# Patient Record
Sex: Female | Born: 1935 | Race: White | Hispanic: No | Smoking: Former smoker
Health system: Southern US, Community
[De-identification: ages and names within clinical notes are randomized; demographics above are authoritative.]

## PROBLEM LIST (undated history)

## (undated) DIAGNOSIS — E039 Hypothyroidism, unspecified: Secondary | ICD-10-CM

## (undated) DIAGNOSIS — I10 Essential (primary) hypertension: Secondary | ICD-10-CM

---

## 2002-01-09 ENCOUNTER — Encounter: Payer: Self-pay | Admitting: Orthopedic Surgery

## 2002-01-15 ENCOUNTER — Encounter (INDEPENDENT_AMBULATORY_CARE_PROVIDER_SITE_OTHER): Payer: Self-pay | Admitting: Specialist

## 2002-01-15 ENCOUNTER — Inpatient Hospital Stay (HOSPITAL_COMMUNITY): Admission: RE | Admit: 2002-01-15 | Discharge: 2002-01-17 | Payer: Self-pay | Admitting: Orthopedic Surgery

## 2002-01-15 ENCOUNTER — Encounter: Payer: Self-pay | Admitting: Orthopedic Surgery

## 2002-01-17 ENCOUNTER — Inpatient Hospital Stay (HOSPITAL_COMMUNITY)
Admission: AD | Admit: 2002-01-17 | Discharge: 2002-01-27 | Payer: Self-pay | Admitting: Physical Medicine & Rehabilitation

## 2006-12-24 ENCOUNTER — Ambulatory Visit: Payer: Self-pay | Admitting: Cardiology

## 2007-07-22 ENCOUNTER — Inpatient Hospital Stay (HOSPITAL_COMMUNITY): Admission: RE | Admit: 2007-07-22 | Discharge: 2007-07-26 | Payer: Self-pay | Admitting: Orthopedic Surgery

## 2010-08-02 NOTE — Op Note (Signed)
Tina Wheeler, Tina Wheeler                  ACCOUNT NO.:  [id number]   MEDICAL RECORD NO.:  [id number]          PATIENT TYPE:  INP   LOCATION:  0011                         FACILITY:  [facility]   PHYSICIAN:  Ollen Gross, M.D.    DATE OF BIRTH:  Oct 22, 1931   DATE OF PROCEDURE:  07/22/2007  DATE OF DISCHARGE:                               OPERATIVE REPORT   PREOPERATIVE DIAGNOSIS:  Recurrent instability, right hip.   POSTOPERATIVE DIAGNOSIS:  Recurrent instability, right hip.   PROCEDURE:  Right total hip arthroplasty revision.   SURGEON:  Ollen Gross, M.D.   ASSISTANT:  Avel Peace PA-C   ANESTHESIA:  General.   ESTIMATED BLOOD LOSS:  300 mL.   DRAINS:  Hemovac times one.   COMPLICATIONS:  None.   CONDITION:  Stable to recovery.   BRIEF CLINICAL NOTE:  Tina Wheeler is a 77 year old female who had a right  total hip arthroplasty revision done with a femoral osteotomy  approximately 5-6 years ago.  She did well until a few months ago when  she started dislocating.  She has had about three dislocations in a  relatively short amount of time.  She presents now for acetabular  revision to constrained liner versus component revision with upsizing  and femoral head.   PROCEDURE IN DETAIL:  After the successful initiation of general  anesthetic, the patient was placed in left lateral decubitus position  with the right side up and held with the hip positioner.  Right lower  extremity was isolated from the perineum with plastic drapes and prepped  and draped in usual sterile fashion.  Posterolateral incision made with  10 blade through subcutaneous tissue to level of fascia lata which was  incised in line with the skin incision.  Sciatic nerve is palpated and  protected and the posterior pseudocapsule excision is completed.  She  already had part of the pseudocapsule avulsed from the femur from her  dislocations.  I excised the pseudocapsule to get down to the acetabular  component.  Hip  dislocated at 70 degrees of flexion and 40 degrees  adduction, 20 degrees internal rotation.  I removed the femoral head.  We attempted to translate the femur anteriorly but could not get good  acetabular closure.  Given that this was SROM stem, I was able to remove  the stem, first by disrupting the taper between the stem and sleeve and  then using the extraction device to take the stem out.  This allowed for  outstanding acetabular exposure.  I removed the tissue around the rim of  the acetabulum and then removed the polyethylene from the cup.  There  were two screws.  There is a 48 mm Osteonics cup.  When I removed the  screws, the cup had some toggle in it and I used the extraction tools to  remove the cup.  The cup was easily removed with essentially no bone  loss at all.  The acetabulum was then reamed starting at 47 and going up  to 51 mm.  A 52 mm Pinnacle Gription multihole cup was then  impacted  with excellent rim fit.  I also placed two additional dome screws with  excellent purchase.  We placed a 36-mm neutral +4 Marathon liner with a  52 cup.  We then reimpacted the long femoral stem back into the femur.  It was the same anteversion as before.  This is about 20 degrees  anteversion.  Once the stem was impacted then I placed a trial 36 + 0  head.  This reduced too easily so we ended up going to a 36 +6 which  still reduced easily and 36 +9 which had the appropriate soft tissue  tension.  There was excellent stability with full extension, full  external rotation, 70 degrees flexion, 40 degrees adduction, 90 degrees  internal rotation and 90 degrees of flexion, 70 degrees of internal  rotation.  By placing the right leg on top of the left, it felt as  though the leg lengths were equal.  The hip was then dislocated and the  permanent 36 + 9 head is placed.  Wounds are copiously irrigated with  saline solution and the posterior pseudocapsule reattached to the femur  through drill  holes.  Fascia lata was closed over Hemovac drain with the  #2 Quill suture.  Deep subcu closed with the 0 Quill suture and the  superficial subcu closed with interrupted 2-0 Vicryl.  The skin is  closed with staples.  Drain was hooked to suction.  Incision cleaned and  dried.  A bulky sterile dressing applied.  She is placed into a knee  immobilizer, awakened and transported to recovery in stable condition.      Ollen Gross, M.D.  Electronically Signed     FA/MEDQ  D:  07/22/2007  T:  07/22/2007  Job:  [id number]

## 2010-08-02 NOTE — Discharge Summary (Signed)
Tina Wheeler, Tina Wheeler                  ACCOUNT NO.:  [id number]   MEDICAL RECORD NO.:  [id number]          PATIENT TYPE:  INP   LOCATION:  1612                         [facility]ACILITY:  [facility]   PHYSICIAN:  Ollen Gross, M.D.    DATE OF BIRTH:  06-May-1931   DATE OF ADMISSION:  07/22/2007  DATE OF DISCHARGE:  07/26/2007                               DISCHARGE SUMMARY   ADMITTING DIAGNOSES:  1. Unstable right total hip.  2. Chronic obstructive pulmonary disease.  3. Hypertension.  4. Cardiac murmur.  5. Varicose veins.  6. Hypothyroidism.  7. Chronic anemia.  8. Chronic low back pain.   DISCHARGE DIAGNOSES:  1. Recurrent instability, right hip, status post revision right total      hip arthroplasty.  2. Mild postoperative blood loss anemia, did not require transfusion.  3. Hyponatremia.  4. Chronic obstructive pulmonary disease.  5. Hypertension.  6. Cardiac murmur.  7. Varicose veins.  8. Hypothyroidism.  9. Chronic anemia.  10.Chronic low back pain.   PROCEDURE:  Jul 22, 2007, revision right total hip arthroplasty.  Surgeon: Dr. Lequita Halt.  Assistant:  Avel Peace, PA-C.  Anesthesia:  General.   CONSULTATIONS:  None.   BRIEF HISTORY:  Ms. Stolze is a 77 year old female who had a right total  hip revision with a femoral osteotomy approximately 5 or 6 years ago.  She did well until a couple of months ago when she started dislocating.  She had about three dislocations in a relatively short amount of time.  Now she presents for an acetabular versus constrained liner.   LABORATORY DATA:  CBC on admission:  Hemoglobin of 13.9, hematocrit of  41.0, white cell count 5.8, red cell count 4.56, platelets 216.  Chem  panel on admission:  Minimally elevated glucose of 111.  Remaining Chem  panel all within normal limits.  PT/INR preop 12.6, 0.9 with a PTT of  25.  Preop UA:  Positive nitrite, large leukocyte esterase, 7-10 white  cell, 0-2 red cells, many bacteria.  This was treated  preoperatively.  Serial CBCs were followed.  Hemoglobin did drop down to 9.1, then 8.6,  came back to 9.0 with a hematocrit of 25.9.  Serial pro times followed.  Last noted PT/INR 18.9 and 1.5.  Serial BMETs were followed.  Sodium did  drop down to 132, stabilized at 131.  Remaining electrolytes remained  within normal limits.  Glucose went up to 126, back down to 120.  Follow-  up UA on Jul 24, 2007:  Negative.  Urine culture: no growth, final.   X-RAYS:  Portable pelvis Jul 22, 2007: status post revision right total  hip arthroplasty.   HOSPITAL COURSE:  The patient was admitted to [facility],  tolerated the procedure well, later transferred to the recovery room and  orthopedic floor, started on PCA and p.o. analgesics.  Given 24 hours of  postop IV antibiotics.  Started on Coumadin for DVT prophylaxis.  She  was pretty sore on the morning of day #1.  Hemoglobin was down.  She had  decent output in her Foley.  Started on iron supplement with the low  hemoglobin.  She did have a known history of chronic anemia.  Blood  pressure was stable, actually normotensive, so we held her diltiazem  below a systolic of 130, started her back on her home medications.  Encouraged incentive spirometer.  Started getting up out of bed, bed to  chair.  By day #2 she was walking short distances of about 11 feet.  She  was doing pretty well but having some soreness and spasms.  Encouraged  muscle relaxants.  Hemoglobin was down a little bit, down to 8.6.  We  left her Foley in for an extra day.  Put her on iron supplementation on  day #1.  We continued that.  She had a preoperative urinary tract  infection which was treated with prior to surgery, so we rechecked it on  day #2 before the Foley was removed.  The follow-up UA was negative.  Follow-up culture was completely negative, no growth.  Slowly  progressed, only walking about 17 feet by postop day #2.  Day #3  initially she wanted to see if she  could go home but with the slow  progression it was felt that she would possibly need inpatient rehab.  We did get social work and discharge planning involved.  FL-2 was signed  and sent out.  By day #3 she was still a little sore, having some  spasms, running a little low-grade temperature.  It was noted that a bed  became available later on that afternoon but with the continued pain and  the elevated temperature, we wanted to watch her one more day.  Encouraged incentive spirometer.  She had not had a bowel movement,  either.  Given a suppository.  Started moving her bowels.  Had a couple  of movements.  Temperature had come back down and by the following day  of Jul 26, 2007, she was doing much better.  Temperature was decreasing.  She was under better control with her pain and spasm and it was decided  she would be discharged at that time.   DISCHARGE/PLAN:  The patient was discharged out on Jul 26, 2007, to [facility] [place]   DISCHARGE DIAGNOSES:  Please see above.   DISCHARGE MEDICATIONS:  Current medications include:   1. Levothyroxine 125 mcg p.o. daily.  2. Cardizem CD 240 mg p.o. daily.  3. Oxybutynin 10 mg p.o. daily.  4. Coumadin protocol.  She needs to be on Coumadin for 3 weeks from      the date of surgery of Jul 22, 2007.  Please titrate the Coumadin      level for target INR between 2.0 and 3.0.  adjust as needed.  5. Colace 100 mg p.o. b.i.d.  6. Nu-Iron 150 mg p.o. b.i.d.  She needs to be on iron supplementation      for 3 weeks from the date of surgery of Jul 22, 2007, then      discontinue the iron.  7. Percocet 5 mg one of two every 4-6 hours as needed for pain.  8. Tylenol 325 mg one or two every 4-6 hours as needed for mild pain,      temperature or headache.  9. Robaxin 500 mg p.o. q.6-8 h. p.r.n. spasm.   ACTIVITY:  She is she is partial weightbearing, 25-50% to the right  lower extremity.  She needs to maintain hip precautions, use  total hip  protocol.  May be  up ambulating with therapy.  She needs to have PT  every day, OT for ADLs.   Daily dressing change.  She did have a little bit of serous drainage  from the incision but no signs of infection.  Continue daily dressing  changes.  She may start showering; however, do not submerge the incision  under water.   FOLLOW-UP:  She is to follow up next Thursday on May 14 or next Friday  on May 15 for postop evaluation, wound check and staple removal.  Please  contact our office at [phone] to help arrange an appointment and  transfer this patient over for follow-up on next Thursday or Friday, May  14 or May 15, with Dr. Ollen Gross at the [facility] office of  [facility].   DIET:  As tolerated.   DISPOSITION:  [facility] in the [place].   CONDITION UPON DISCHARGE:  Improving.      Alexzandrew L. Perkins, P.A.C.      Ollen Gross, M.D.  Electronically Signed    ALP/MEDQ  D:  07/26/2007  T:  07/26/2007  Job:  [id number]   cc:   Meredith Mody, MD  [facility]  [address]  [place], [place]  FAX [phone]   [facility]  [place], [place]

## 2010-08-02 NOTE — H&P (Signed)
Tina Wheeler, Tina Wheeler                  ACCOUNT NO.:  [id number]   MEDICAL RECORD NO.:  [id number]          PATIENT TYPE:  INP   LOCATION:  NA                           FACILITY:  [facility]   PHYSICIAN:  Ollen Gross, M.D.    DATE OF BIRTH:  1932-01-16   DATE OF ADMISSION:  07/22/2007  DATE OF DISCHARGE:                              HISTORY & PHYSICAL   CHIEF COMPLAINT:  Unstable right hip with recurrent dislocation.   HISTORY OF PRESENT ILLNESS:  The patient is a 77 year old female who  unfortunately has undergone recurrent dislocations requiring closed  reductions of her unstable right total hip. It has become more unstable  recently. She has been seen by Dr. Lequita Halt and felt that she would  benefit undergoing revision, conversion over to constrained liner versus  the right acetabular revision.  Risks and benefits have been discussed,  and she elects to proceed with surgery.   ALLERGIES:  No known drug allergies.   CURRENT MEDICATIONS:  Oxybutynin, ibuprofen, diltiazem XR,  levothyroxine, hydrocodone.   PAST MEDICAL HISTORY:  COPD, hypertension, cardiac murmur, varicose  veins, hypothyroidism, chronic anemia, chronic low back pain.   PAST SURGERIES:  Right total hip in October 2003.  She has undergone  several closed reductions for dislocations.   SOCIAL HISTORY:  Widowed, past smoker.  No alcohol.  Seven children  living. Family will be assisting with care after surgery.   FAMILY HISTORY:  She has a daughter with breast cancer.   REVIEW OF SYSTEMS:  GENERAL:  No fevers, chills or night sweats.  NEUROLOGICAL: No seizure, syncope, or paralysis.  RESPIRATORY: No  shortness of breath, productive cough, or hemoptysis.  CARDIOVASCULAR:  No chest pain or orthopnea.  GI: No nausea, vomiting, diarrhea, or  constipation.  GU: She does have some urinary frequency.  No dysuria or  hematuria.  MUSCULOSKELETAL:  Recurrent dislocations of the right hip.   PHYSICAL EXAMINATION:  VITAL SIGNS:  Pulse 72, respirations 14, blood  pressure 160/74.  GENERAL:  A 75-year white female, well-nourished, well-developed in no  acute distress.  She is alert and cooperative, awake, accompanied by her  family.  HEENT: Normocephalic, atraumatic.  Pupils are round and reactive.  Noted  to wear glasses.  EOM's intact.  NECK:  Supple.  CHEST: Clear.  HEART: Regular rate and rhythm with a faint early systolic ejection  murmur noted.  S1-S2 noted.  ABDOMEN: Soft, round, slightly protuberant.  Bowel sounds present.  Rectal, breasts, and genitalia not done as not pertinent to present  illness.  EXTREMITIES:  Right hip flexion 95, internal rotation 20, external  rotation 20, abduction 30. She does have a hip abduction brace in place.   IMPRESSION:  1. Unstable right total hip.  2. Chronic obstructive pulmonary disease.  3. Hypertension.  4. Cardiac murmur.  5. Varicose pain.  6. Hypothyroidism.  7. Chronic anemia.  8. Chronic low back pain.   PLAN:  The patient was admitted to [facility] to undergo a  right acetabular revision versus a constrained liner surgery will be  performed by Dr.  Homero Fellers Aluisio.      Alexzandrew L. Perkins, P.A.C.      Ollen Gross, M.D.  Electronically Signed    ALP/MEDQ  D:  07/21/2007  T:  07/21/2007  Job:  [id number]   cc:   Meredith Mody, MD  [facility]  [address]  [place], [place]  EAV # [phone]

## 2010-08-05 NOTE — Discharge Summary (Signed)
NAME:  Tina Wheeler, Tina Wheeler                            ACCOUNT NO.:  [id number]   MEDICAL RECORD NO.:  [id number]                   PATIENT TYPE:  INP   LOCATION:  0478                                 FACILITY:  [facility]   PHYSICIAN:  Ollen Gross, M.D.                 DATE OF BIRTH:  06/18/1931   DATE OF ADMISSION:  01/15/2002  DATE OF DISCHARGE:  01/17/2002                                 DISCHARGE SUMMARY   ADMITTING DIAGNOSES:  1. Loose right total hip replacement arthroplasty.  2. Hypertension.  3. Hypothyroidism.   DISCHARGE DIAGNOSES:  1. Loose right total hip arthroplasty status post revision right total hip     arthroplasty with corrective femoral osteotomy and strut Allografting of     the femur.  2. Postoperative blood loss anemia.  3. Status post transfusion cell saver blood approximately 300 cc.  4. Postoperative hyponatremia.  5. Hypertension.  6. Hypothyroidism.   PROCEDURE:  The patient was taken to the OR on January 15, 2002.  Underwent  a revision right total hip replacement arthroplasty with corrected femoral  osteotomy and strut Allografting of the femur.  Surgeon Dr. Homero Fellers Aluisio.  Assistant Avel Peace, P.A.-C.  Surgery under general anesthesia.  Estimated blood loss 800 cc.  Hemovac drain x1.  She did have blood  transfusion replacement of approximately 300 cc of cell saver packed red  cells.   CONSULTS:  Rehabilitation Services.   BRIEF HISTORY:  The patient is a 77 year old female who has been seen for  ongoing right pain.  She is seen in second opinion in regard to a painful  right hip.  This has been ongoing for several months now.  Original problem  dates back to 1999 when she had fixation of an intertrochanteric hip  fracture by Dr. Jenelle Mages in [place].  The hip fixation device went on to  collapse and in 2000 she underwent a calcar hip replacement.  She did fairly  well until recently this year when she started developing progressive  problems.   She was seen by Dr. Grayland Jack who took x-rays demonstrating a  loose femoral prosthesis.  She was referred over to Dr. Lequita Halt for  consideration of surgery.  X-rays showed a grossly loose femoral component  which subsided over 1 cm from 2001.  There was loose cement with a cracked  cement mantle and bending of the lateral cortex and some balloon dilatation  at the lateral cortex.  Due to the significant findings on examination and x-  ray and also the pending catastrophic failure of the hip arthroplasty  prosthesis, it is felt she would best be served by undergoing revision of  the prosthesis.  Surgery will be performed by Dr. Ollen Gross.  Risks and  benefits discussed and the patient has elected to proceed with surgery.   LABORATORY DATA:  CBC on admission hemoglobin 10.8, hematocrit 32.6, white  cell count 5.0, red cell count 4.22.  Postoperative H&H 11.08 and 36.2.  This was post transfusion cell saver blood.  Last noted H&H was 10.6 and  31.5.  Differential on admission all within normal limits.  PT and PTT on  admission were 12.8 and 29, respectively with INR of 0.9.  Serial pro times  followed.  Last noted PT/INR prior to transfer was 18.6 and 1.7.  Chem panel  on admission all within normal limits.  Follow-up BMET showed a drop in  sodium down to 134, increasing glucose to 145.  Follow-up BMET showed a  continued drop in sodium down to 130, glucose back down to 132.  Urinalysis  on admission only showed small leukocyte esterase, only few epithelial  cells, 3-6 white cells, and few bacteria.  Blood group type A+.  Gram stain  of the fluid taken at the time of surgery on January 15, 2002:  No  organisms, no white cells.  Wound culture taken at the time of surgery on  January 15, 2002:  No wbc's, no organisms, no growth.  Anaerobic culture:  No wbc's, no organisms, and no growth.   X-rays taken on January 09, 2002:  Right total hip replacement arthroplasty.   Surgical pathology  report:  Right femoral canal excisional biopsy:  Chronic  synovitis with foreign body, giant cell reaction, granulation tissue, and  fibrosis.  Right hip capsule excisional biopsy:  Chronic synovitis with  foreign body, giant cell reaction, granulation tissue, and fibrosis.  No  acute inflammation is identified in either specimen.  Frozen sections of  right femoral canal and also right hip capsule both showed no significant  acute inflammation.   HOSPITAL COURSE:  The patient was admitted to [facility], taken to  OR.  Underwent above stated procedure without complications.  She did have a  fair amount of blood loss.  Cell saver was used.  She was transfused back  300 cc of cell saver blood.  She tolerated the transfusion well.  She was  placed on PC analgesics and p.o. analgesics for pain control following  surgery.  She was given 48 hours of postoperative IV antibiotics.  Laboratories were followed.  They can be found in laboratory section as  above.  The Hemovac drain was left in on day one and pulled on day two  without difficulty.  On postoperative day one patient was doing actually  very well for the amount of surgery she had undergone.  Her main complaint  was a sore throat secondary to the endotracheal tube from her general  anesthetic.  She was up out of bed with physical therapy.  By postoperative  day two she was actually ambulating approximately 10 feet and anticipated a  slow progress.  Therefore, the patient was seen in consultation by  rehabilitation services.  It was felt the patient would be appropriate  candidate for rehabilitation stay.  On day two her pain control was much  improved.  The PCA and IV were discontinued.  Her hemoglobin was stable at  10.6.  She was up moving with therapy.  It was noted later that day that the  patient did have a bed open up on rehabilitation services.  The patient was doing well.  She was slowly progressing with physical therapy  and felt to be  an appropriate candidate for the rehabilitation stay.  She was  hemodynamically stable, tolerating her medications, and it was decided the  patient would be transferred to  rehabilitation for continued therapy.   DISCHARGE PLAN:  The patient transferred to [facility].   DISCHARGE DIAGNOSES:  Please see above.   DISCHARGE MEDICATIONS:  1. Percocet for pain.  2. Robaxin for spasm.  3. She is currently on Trinsicon t.i.d.  4. Colace 100 mg p.o. b.i.d.  5. Atenolol 50 mg p.o. q.d.  6. Synthroid 100 mcg p.o. q.d.  7. Coumadin per pharmacy for three weeks.   DIET:  Low sodium diet.   ACTIVITY:  Touchdown weightbearing to the right lower extremity.  Continue  with physical therapy and occupational therapy for gait training ambulation,  ADLs while in rehabilitation services.  Hip precautions at all times.  May  start showering around postoperative day four.   DISPOSITION:  [facility].   FOLLOW UP:  The patient will follow up in the office two weeks from surgery  or following the discharge from the rehabilitation services.  It was noted  patient did have low sodium at the time of transfer.  Recommend follow-up  BMET for the hyponatremia.   CONDITION ON DISCHARGE:  Improved.     Alexzandrew L. Julien Girt, P.A.              Ollen Gross, M.D.    ALP/MEDQ  D:  02/12/2002  T:  02/12/2002  Job:  [id number]   cc:   Rehab Services   Arville Care, M.D.  [address], [place]

## 2010-08-05 NOTE — Discharge Summary (Signed)
NAME:  Tina Wheeler, Tina Wheeler                            ACCOUNT NO.:  [id number]   MEDICAL RECORD NO.:  [id number]                   PATIENT TYPE:  INP   LOCATION:  4140                                 FACILITY:  [facility]   PHYSICIAN:  Erick Colace, M.D.           DATE OF BIRTH:  11-Apr-1931   DATE OF ADMISSION:  01/17/2002  DATE OF DISCHARGE:  01/27/2002                                 DISCHARGE SUMMARY   DISCHARGE DIAGNOSES:  1. Revision of right total hip arthroplasty 01/15/02.  2. Anemia.  3. Hypertension.  4. Hypothyroidism.  5. History of a right total hip replacement in 2000.   HISTORY OF PRESENT ILLNESS:  A 77 year old female with history of right  intertrochanteric hip fracture in 1999 with repair per Dr. Jenelle Mages in  [place] with subsequent collapse of hip fixation device, admitted to  [facility] 10/29 with persistent right hip pain times several  months.  On evaluation, x-rays with loosening of femoral components.  Underwent revision of right total hip arthroplasty 01/15/02 per Dr. Lequita Halt,  placed on Coumadin for deep vein thrombosis prophylaxis, touchdown  weightbearing.  Postoperative pain management.  No chest pain or shortness  of breath.  Latest INR 1.7.  Hemoglobin 10.6.  Admitted for comprehensive  rehab program.   PAST MEDICAL HISTORY:  See discharge diagnoses.   ALLERGIES:  MORPHINE.   SOCIAL HISTORY:  Remote smoker, no alcohol.  Divorced.  Lives with daughter  and son-in-law.  Independent with a cane prior to admission.  Two-level home  without steps to entry, bedroom downstairs.  Son-in-law can assist on  discharge.   PAST SURGICAL HISTORY:  Cholecystectomy, tubal ligation.   PRIMARY CARE PHYSICIAN:  Dr. Arville Care, [phone].   MEDICATIONS:  Synthroid and atenolol.   HOSPITAL COURSE:  The patient did well while on rehabilitation services with  therapies initiated on a b.i.d. basis.  The following issues were followed  during the patient's  rehab course.  Pertaining to the patient's revision  right total hip arthroplasty, surgical site healing nicely, staples have  been removed.  She was 25% partial weightbearing.  She was to follow up with  Dr. Lequita Halt, [phone], two weeks after discharge.  She continued on Coumadin  for deep vein thrombosis prophylaxis.  Date of discharge, INR 2.7.  A home  health nurse had been arranged.  She would be followed by Dr. Arville Care, who was  contacted on the morning of discharge to follow Coumadin therapy, [phone].  She would have her next blood-thinning draw time on 01/29/02.  Blood  pressures remained controlled on Tenormin.  Hormone supplement ongoing for  hypothyroidism.  She had no bowel or bladder disturbances.  Overall for her  functional mobility she was ambulating extended household distances with a  walker, essentially independent, standby assist in all areas of activities  of daily living in dressing, grooming, and homemaking.  Her strength and  endurance greatly improved.  She was encouraged with her overall progress.  She was discharged to home with home health therapies.   LABORATORY DATA:  Latest labs showed an INR 2.7.  Hemoglobin 8.9, hematocrit  26.7.  Sodium 137, potassium 3.5, BUN 13, creatinine 0.7.   DISCHARGE MEDICATIONS:  1. Coumadin 3 mg daily, to be completed 02/15/02.  2. Tenormin 50 mg daily.  3. Synthroid 100 mcg daily.  4. Darvocet as needed for pain.  5. Tylenol as needed.  6. Protonix 40 mg daily.   DISCHARGE INSTRUCTIONS:  Activity was 25% partial weightbearing with hip  precautions.  Diet regular.   SPECIAL INSTRUCTIONS:  No aspirin or ibuprofen while on Coumadin.  Home  health nurse to check INR on Wednesday, 11/12.  Results to Dr. Arville Care, [phone].  She should follow with Dr. Lequita Halt, [phone], two weeks after  discharge.  It was noted that her Trinsicon had been discontinued during her  rehab course due to nausea that had resolved.      [name], P.A.                     Erick Colace, M.D.    DA/MEDQ  D:  01/27/2002  T:  01/27/2002  Job:  [id number]   cc:   Ollen Gross, M.D.  [address]  [place]  [place] [address]  Fax: [phone]   Fax [phone] Dr. Arville Care, phone [phone]

## 2010-08-05 NOTE — H&P (Signed)
NAME:  Tina Wheeler, Tina Wheeler                            ACCOUNT NO.:  [id number]   MEDICAL RECORD NO.:  [id number]                   PATIENT TYPE:  INP   LOCATION:  NA                                   FACILITY:  [facility]   PHYSICIAN:  Ollen Gross, MD                   DATE OF BIRTH:  04/17/1931   DATE OF ADMISSION:  01/15/2002  DATE OF DISCHARGE:                                HISTORY & PHYSICAL   CHIEF COMPLAINT:  Right hip pain from a loose total hip arthroplasty.   HISTORY OF PRESENT ILLNESS:  The patient is a 77 year old female who has  been seen for continued ongoing right hip pain.  She is seen second opinion  in regards to a painful right hip replacement.  This has been going on for  several months now.  The original problem dates back to 1999 when she had  fixation of an intertrochanteric hip fracture by Dr. Jenelle Mages in [place].  The hip fixation device went on to collapse and in 2000 she underwent a  cemented calcar hip replacement performed with a total hip.  She did fairly  well until recently this year when she started developing some progressive  increasing pain.  The pain was worse with activity but did occur at rest.  She was recently seen by Dr. Grayland Jack who took x-rays demonstrating a  loose femoral prosthesis.  She is seen in evaluation by Dr. Lequita Halt for  consideration of surgery.  X-rays reviewed did show a grossly loose femoral  component which has subsided over a centimeter in 2001.  There is loose  cement with a cracked cement mantel and thinning of the lateral cortex with  some balloon dilatation in the lateral cortex.  Due to the significant  findings and possibly pending catastrophic failure, it is felt the patient  would be best served by undergoing revision of the right total hip with  revision of the acetabular liner and possible femoral osteotomy and strut  grafting.  It is also discussed with her if infection is determined at the  time of surgery, that she  will need to undergo a complete resection of the  total hip with it being only the first stage of a two-stage procedure.  The  risks and benefits are discussed and she has elected to proceed with  surgery.   ALLERGIES:  MORPHINE, patient states it shuts down my system.   CURRENT MEDICATIONS:  1. Synthroid one p.o. q.d.  2. Atenolol one p.o. q.d.  The patient is to bring medications to the hospital.   PAST MEDICAL HISTORY:  1. Hypothyroidism.  2. Hypertension.   PAST SURGICAL HISTORY:  1. Open reduction internal fixation right hip in 1999.  2. Right total hip replacement in 2000.  3. Cholecystectomy.  4. Tubal ligation.   SOCIAL HISTORY:  She is divorced.  She smoked one pack  per day for  approximately 41 years, quit five years ago.  Denies the use of alcohol  products.  She lives with her son and daughter-in-law.   FAMILY HISTORY:  Mother living in her 75s.  Father's history is unknown.   REVIEW OF SYSTEMS:  GENERAL:  No fever, chills or night sweats.  NEUROLOGIC:  No seizure, syncope or paralysis.  RESPIRATORY:  She does have some  shortness of breath on exertion, however, none at rest.  CARDIOVASCULAR:  No  chest pain, angina or orthopnea.  GI:  No nausea, vomiting, diarrhea,  constipation, and no blood or mucous in the stool.  GU:  She does have a  little bit of urinary frequency.  No dysuria, hematuria or discharge.  MUSCULOSKELETAL:  Pertinent for the right hip found in history of present  illness.   PHYSICAL EXAMINATION:  VITAL SIGNS:  Pulse 60, respirations 24, blood  pressure 162/88.  GENERAL:  The patient is a 77 year old white female, short stature, well-  developed, overweight, in no acute distress.  She is alert, oriented and  cooperative.  HEENT:  Normocephalic, atraumatic.  Pupils are round and reactive.  Oropharynx is clear.  EOMs are intact.  NECK:  Supple.  CHEST:  She does have distant breath sounds noted with crackles noted in the  right base,  otherwise clear.  HEART:  Regular rhythm and rate.  No murmur.  S1 and S2 noted.  ABDOMEN:  Soft, round, bowel sounds are present.  No rebound or guarding.  RECTAL/BREASTS/GENITALIA:  Not done, not pertinent to present illness.  EXTREMITIES:  Significant to the right hip and lower leg.  She does have hip  flexion up to 90 degrees, internal rotation of 10, external rotation of 30,  and abduction of 30.  She does ambulate with an antalgic gait with a  significant abductor lurch.   RADIOLOGY:  X-rays show a grossly loose femoral component which has subsided  over a centimeter since 2001.  There is also noted to have loose cement with  a cracked cement mantel and thinning with balloon dilatation of the lateral  cortex.  The lateral cortex is thin to the point where she is extremely high  risk of a periprosthetic  fracture.   IMPRESSION:  1. Loose right total hip replacement arthroplasty.  2. Hypertension.  3. Hypothyroidism.   PLAN:  The patient will be admitted to [facility] and undergo  revision of the right total hip replacement and arthroplasty.  This surgery  is to be performed by Dr. Ollen Gross.     Alexzandrew L. Julien Girt, P.A.              Ollen Gross, MD    ALP/MEDQ  D:  01/10/2002  T:  01/11/2002  Job:  [id number]

## 2010-08-05 NOTE — Op Note (Signed)
NAME:  Tina Wheeler, Tina Wheeler                            ACCOUNT NO.:  [id number]   MEDICAL RECORD NO.:  [id number]                   PATIENT TYPE:  INP   LOCATION:  O130                                 FACILITY[facility]  [facility]   PHYSICIAN:  Ollen Gross, MD                   DATE OF BIRTH:  10/29/1931   DATE OF PROCEDURE:  01/15/2002  DATE OF DISCHARGE:                                 OPERATIVE REPORT   PREOPERATIVE DIAGNOSIS:  Loose right total hip arthroplasty.   POSTOPERATIVE DIAGNOSIS:  Loose right total hip arthroplasty.   PROCEDURE:  Right total hip arthroplasty revision with corrective femoral  osteotomy and strut allografting of the femur.   SURGEON:  Gus Rankin. Aluisio, M.D.   ASSISTANT:  Alexzandrew L. Julien Girt, P.A.   ANESTHESIA:  General.   ESTIMATED BLOOD LOSS:  800.   DRAIN:  Hemovac x 1.   REPLACEMENT:  Approximately 300 Cellsaver units packed red blood cells.   CONDITION:  Stable to recovery.   BRIEF CLINICAL NOTE:  Tina Wheeler is a 77 year old female, who had an  intertrochanteric fracture treated with compression hip screw in [place]  about four years ago, which subsequently failed.  Then she had a cemented  long stem hemiarthroplasty placed two and a half years ago.  She has had  progressive pain and radiographic evidence of loosening.  Preoperative labs  were negative for infection.  She presents now for total hip arthroplasty  revision versus possible resection arthroplasty.   PROCEDURE IN DETAIL:  After the successful administration of general  anesthetic, the patient is placed in the left lateral decubitus position  with the right side up and held with the hip positioner.  The right lower  extremity is isolated from her perineum with plastic drapes and prepped and  draped in the usual sterile fashion.  She had a very long posterolateral  incision throughout almost the entire length of the thigh which was  reutilized.  Skin cut with a 10 blade through subcutaneous  tissue to the  level of fascia lata which is incised in line with the skin incision.  The  sciatic nerve is palpated and protected and the short rotators in the seat  of the capsule are removed off the posterior femur.  We can get very little  fluid in the joint, and this is sent for stat Gram stain CNS which was  negative.  We also sent several tissue specimens including from the femoral  canal and from the capsule, all of which had negative Gram stains and had  negative frozen sections.  Once we able time to dislocate the hip, it was  noted that the stem was grossly loose.  I removed all of the soft tissue  around the proximal portion of the stem and then easily removed the stem.  The cement came out with it.  We then used a  backbiting curette to remove  the fibrous tissue from the femoral canal.  There was a tremendous amount of  tissue.  That was also sent for frozen section which was negative.  The  lateral cortex of the femur was then exposed due to severe bowing of the  femur from migration of the stem.  It was decided that we were going to have  to do a corrective osteotomy in order to allow for passage of a stem down  the canal.  This was per our preoperative planning and intraoperative  findings.  The area where the ballooned out where the cap of cortex was  opened up with a cortical window.  I decided to do my reaming at that point.  Thus, we passed the guide rod for the flexible reamer down the femoral canal  and flexible reamed up to 18.5 mm to allow for the passage of a 22 x 17  stem.  We then put a straight reamer in to see where the apex of the  deformity was actually lying, and it did lie at this point.  I then took two  femoral strut allografts and configured them to allow for straightening of  the femur once they would be applied after the osteotomy is performed.  We  then passed four Dalle-Miles cables, two proximal, two distal to the planned  osteotomy site.  With the  TPS saw, I cut through the lateral cortex,  anterior and posterior cortex and just scored the medial cortex.  We then  placed the strut grafts on and sequentially tightened all the cables.  This  led to audible breaking through the medial cortex at the osteotomy line and  led to correction of the biplanar deformity, both in the AP and mediolateral  planes so that the femur was straightened out.  This was a very stable  construct.  At this point, we did a proximal reaming of the femur with the  22B, D, and F.  The sleeve was machined to a large.  The trial 22 large  sleeve is placed.  We then placed a 22 x 17 stem with  36+21+ 8 calcar replacement neck.  I placed it into 20 degrees of  anteversion.  We then put a trial 26 plus 0 head, did the reduction, and the  stability was found to be very good.  Her leg length was restored.  I did  not want to use a 26 head and elected to change the poly out because it had  been in for close to three years.  We subsequently removed all of the trials  from the femoral side, had obtained acetabular exposure.  I removed the  polyethylene from the acetabular shell and replaced it with a 28 mm 10  degree liner with a 10 degree wall at the 11 o'clock position.  We then  placed the permanent 69F large sleeve in the proximal femur and then the 22  x 17 extra long stem for the right side with the anterior bow, and this had  a 36 plus 21 plus 8 neck.  We impacted this, and the rotation ended up  following 20 degrees of anteversion which we had hoped for with the final  prods.  Once the stem was fully impacted, then we  trialed a 28 plus 0 head  which reduced and had excellent stability.  We placed a permanent 28 plus 0  head and reduced the hip.  This had excellent stability throughout.  We had  full extension, full external rotation, 70 degrees flexion, 40 degrees  adduction, and 70 degrees internal rotation; then 90 degrees of flexion, 70 degrees internal  rotation.  The wound was then copiously irrigated with  antibiotic solution and x-rays taken in AP and lateral to confirm if the  stem was indeed in the femoral canal.  There was no evidence of  periprosthetic fracture.  The vastus lateralis fascia was then closed with  running #1 Vicryl.  The ______capsule reattached to the femur with Ethibond  through drill holes, and the fascia lata closed over a Hemovac drain with  interrupted #1 Vicryl, subcu closed in multiple layers with #1 and 2-0  Vicryl, and skin closed with staples.  Drain is hooked to suction.  Incision  clean and dry and a bulky sterile dressing applied.  The patient is placed  into a knee immobilizer, awakened, and  transported to recovery in stable  condition                                               Ollen Gross, MD    FA/MEDQ  D:  01/15/2002  T:  01/15/2002  Job:  [id number]

## 2010-12-14 LAB — COMPREHENSIVE METABOLIC PANEL
ALT: 20
AST: 24
CO2: 25
Calcium: 10.4
Chloride: 106
GFR calc Af Amer: >60
GFR calc non Af Amer: >60
Glucose, Bld: 111 — ABNORMAL HIGH
Sodium: 138
Total Bilirubin: 0.9

## 2010-12-14 LAB — URINALYSIS, ROUTINE W REFLEX MICROSCOPIC
Glucose, UA: NEGATIVE
Glucose, UA: NEGATIVE
Hgb urine dipstick: NEGATIVE
Ketones, ur: NEGATIVE
Protein, ur: NEGATIVE
Specific Gravity, Urine: 1.007
Urobilinogen, UA: 0.2
pH: 6.5

## 2010-12-14 LAB — CBC
HCT: 25.2 — ABNORMAL LOW
HCT: 25.9 — ABNORMAL LOW
HCT: 26.7 — ABNORMAL LOW
Hemoglobin: 13.9
Hemoglobin: 8.6 — ABNORMAL LOW
Hemoglobin: 9 — ABNORMAL LOW
MCHC: 34
MCHC: 34.5
MCV: 89.9
MCV: 90.1
Platelets: 157
RBC: 2.88 — ABNORMAL LOW
RBC: 4.56
RDW: 12.5
WBC: 4.3
WBC: 5.8
WBC: 6

## 2010-12-14 LAB — ABO/RH: ABO/RH(D): A POS

## 2010-12-14 LAB — PROTIME-INR
INR: 1.7 — ABNORMAL HIGH
Prothrombin Time: 12.6
Prothrombin Time: 14.6

## 2010-12-14 LAB — BASIC METABOLIC PANEL
BUN: 9
CO2: 28
Calcium: 8.8
Chloride: 101
Chloride: 102
Creatinine, Ser: 0.86
GFR calc Af Amer: >60
GFR calc non Af Amer: >60
GFR calc non Af Amer: >60
Glucose, Bld: 122 — ABNORMAL HIGH
Potassium: 4.3
Potassium: 4.4
Potassium: 4.8
Sodium: 131 — ABNORMAL LOW
Sodium: 132 — ABNORMAL LOW

## 2010-12-14 LAB — URINE CULTURE: Culture: NO GROWTH

## 2010-12-14 LAB — URINE MICROSCOPIC-ADD ON

## 2010-12-14 LAB — TYPE AND SCREEN: ABO/RH(D): A POS

## 2012-04-25 ENCOUNTER — Encounter (HOSPITAL_COMMUNITY): Payer: Self-pay

## 2012-04-25 ENCOUNTER — Inpatient Hospital Stay (HOSPITAL_COMMUNITY)
Admission: AD | Admit: 2012-04-25 | Discharge: 2012-04-30 | DRG: 494 | Disposition: A | Discharge status: Skilled Nursing Facility | Payer: Medicare Other | Source: Other Acute Inpatient Hospital | Attending: Orthopedic Surgery | Admitting: Orthopedic Surgery

## 2012-04-25 DIAGNOSIS — I10 Essential (primary) hypertension: Secondary | ICD-10-CM | POA: Diagnosis present

## 2012-04-25 DIAGNOSIS — Z87891 Personal history of nicotine dependence: Secondary | ICD-10-CM

## 2012-04-25 DIAGNOSIS — D649 Anemia, unspecified: Secondary | ICD-10-CM | POA: Diagnosis present

## 2012-04-25 DIAGNOSIS — S82143A Displaced bicondylar fracture of unspecified tibia, initial encounter for closed fracture: Secondary | ICD-10-CM | POA: Diagnosis present

## 2012-04-25 DIAGNOSIS — S82109A Unspecified fracture of upper end of unspecified tibia, initial encounter for closed fracture: Principal | ICD-10-CM | POA: Diagnosis present

## 2012-04-25 DIAGNOSIS — W108XXA Fall (on) (from) other stairs and steps, initial encounter: Secondary | ICD-10-CM | POA: Diagnosis present

## 2012-04-25 DIAGNOSIS — Z79899 Other long term (current) drug therapy: Secondary | ICD-10-CM

## 2012-04-25 DIAGNOSIS — Y92009 Unspecified place in unspecified non-institutional (private) residence as the place of occurrence of the external cause: Secondary | ICD-10-CM

## 2012-04-25 HISTORY — DX: Essential (primary) hypertension: I10

## 2012-04-25 MED ORDER — HYDROCODONE-ACETAMINOPHEN 5-325 MG PO TABS
1.0000 | ORAL_TABLET | ORAL | Status: DC | PRN
  Administered 2012-04-25: 1 via ORAL
  Administered 2012-04-26: 2 via ORAL
  Administered 2012-04-26 (×3): 1 via ORAL
  Administered 2012-04-26 – 2012-04-27 (×2): 2 via ORAL
  Filled 2012-04-25 (×3): qty 1
  Filled 2012-04-25: qty 2
  Filled 2012-04-25 (×2): qty 1
  Filled 2012-04-25 (×2): qty 2
  Filled 2012-04-25: qty 1

## 2012-04-25 MED ORDER — HYDROMORPHONE HCL PF 1 MG/ML IJ SOLN
0.5000 mg | INTRAMUSCULAR | Status: DC | PRN
  Administered 2012-04-26: 0.5 mg via INTRAVENOUS
  Administered 2012-04-27: 1 mg via INTRAVENOUS
  Filled 2012-04-25 (×2): qty 1

## 2012-04-25 MED ORDER — ONDANSETRON HCL 4 MG/2ML IJ SOLN: 4.0000 mg | Freq: Four times a day (QID) | INTRAMUSCULAR | Status: DC | PRN

## 2012-04-25 MED ORDER — ZOLPIDEM TARTRATE 5 MG PO TABS
5.0000 mg | ORAL_TABLET | Freq: Every evening | ORAL | Status: DC | PRN
  Administered 2012-04-25: 5 mg via ORAL
  Filled 2012-04-25: qty 1

## 2012-04-25 MED ORDER — DOCUSATE SODIUM 100 MG PO CAPS
100.0000 mg | ORAL_CAPSULE | Freq: Two times a day (BID) | ORAL | Status: DC
  Administered 2012-04-25 – 2012-04-30 (×9): 100 mg via ORAL

## 2012-04-25 MED ORDER — ALUM & MAG HYDROXIDE-SIMETH 200-200-20 MG/5ML PO SUSP: 30.0000 mL | Freq: Four times a day (QID) | ORAL | Status: DC | PRN

## 2012-04-25 MED ORDER — ENOXAPARIN SODIUM 40 MG/0.4ML ~~LOC~~ SOLN
40.0000 mg | SUBCUTANEOUS | Status: DC
  Administered 2012-04-26: 40 mg via SUBCUTANEOUS
  Filled 2012-04-25 (×2): qty 0.4

## 2012-04-25 MED ORDER — POLYETHYLENE GLYCOL 3350 17 G PO PACK: 17.0000 g | PACK | Freq: Every day | ORAL | Status: DC | PRN

## 2012-04-25 MED ORDER — FLEET ENEMA 7-19 GM/118ML RE ENEM: 1.0000 | ENEMA | Freq: Once | RECTAL | Status: AC | PRN

## 2012-04-25 MED ORDER — POTASSIUM CHLORIDE 2 MEQ/ML IV SOLN
INTRAVENOUS | Status: DC
  Administered 2012-04-25 – 2012-04-26 (×2): via INTRAVENOUS
  Filled 2012-04-25 (×4): qty 1000

## 2012-04-25 MED ORDER — ONDANSETRON HCL 4 MG PO TABS: 4.0000 mg | ORAL_TABLET | Freq: Four times a day (QID) | ORAL | Status: DC | PRN

## 2012-04-25 MED ORDER — BISACODYL 10 MG RE SUPP: 10.0000 mg | Freq: Every day | RECTAL | Status: DC | PRN

## 2012-04-26 ENCOUNTER — Encounter (HOSPITAL_COMMUNITY): Payer: Self-pay

## 2012-04-26 ENCOUNTER — Ambulatory Visit (HOSPITAL_COMMUNITY): Payer: Medicare Other

## 2012-04-26 ENCOUNTER — Inpatient Hospital Stay (HOSPITAL_COMMUNITY): Payer: Medicare Other

## 2012-04-26 LAB — CBC
HCT: 26.7 % — ABNORMAL LOW (ref 36.0–46.0)
Hemoglobin: 8.8 g/dL — ABNORMAL LOW (ref 12.0–15.0)
RDW: 15.5 % (ref 11.5–15.5)
WBC: 4.9 10*3/uL (ref 4.0–10.5)

## 2012-04-26 LAB — BASIC METABOLIC PANEL
Chloride: 96 mEq/L (ref 96–112)
Creatinine, Ser: 0.98 mg/dL (ref 0.50–1.10)
GFR calc Af Amer: 62 mL/min — ABNORMAL LOW (ref >90)
Potassium: 4.3 mEq/L (ref 3.5–5.1)

## 2012-04-26 MED ORDER — LEVOTHYROXINE SODIUM 125 MCG PO TABS
125.0000 ug | ORAL_TABLET | Freq: Every day | ORAL | Status: DC
  Administered 2012-04-28 – 2012-04-30 (×3): 125 ug via ORAL
  Filled 2012-04-26 (×6): qty 1

## 2012-04-26 MED ORDER — ENALAPRIL MALEATE 10 MG PO TABS
10.0000 mg | ORAL_TABLET | Freq: Every day | ORAL | Status: DC
  Administered 2012-04-26 – 2012-04-30 (×4): 10 mg via ORAL
  Filled 2012-04-26 (×5): qty 1

## 2012-04-26 MED ORDER — ENALAPRIL-HYDROCHLOROTHIAZIDE 10-25 MG PO TABS: 1.0000 | ORAL_TABLET | Freq: Every day | ORAL | Status: DC

## 2012-04-26 MED ORDER — HYDROCHLOROTHIAZIDE 25 MG PO TABS
25.0000 mg | ORAL_TABLET | Freq: Every day | ORAL | Status: DC
  Administered 2012-04-26 – 2012-04-30 (×4): 25 mg via ORAL
  Filled 2012-04-26 (×5): qty 1

## 2012-04-26 NOTE — Care Management Note (Addendum)
    Page 1 of 1   04/29/2012     5:30:36 PM   CARE MANAGEMENT NOTE 04/29/2012  Patient:  Tina Wheeler, Tina Wheeler   Account Number:  [id number]  Date Initiated:  04/26/2012  Documentation initiated by:  Colleen Can  Subjective/Objective Assessment:   dx fx tib-fib     Action/Plan:   Cm spoke with patient. States she lives in [place], [place] with daughter and son-in-law. Discharge plans incomplete at this time. CM will follow after surgery   Anticipated DC Date:  04/29/2012   Anticipated DC Plan:  SKILLED NURSING FACILITY  In-house referral  Clinical Social Worker      DC Planning Services  CM consult      [facility] Choice  NA   Choice offered to / List presented to:  C-1 Patient           Status of service:  Completed, signed off Medicare Important Message given?   (If response is "NO", the following Medicare IM given date fields will be blank) Date Medicare IM given:   Date Additional Medicare IM given:    Discharge Disposition:    Per UR Regulation:  Reviewed for med. necessity/level of care/duration of stay  If discussed at Long Length of Stay Meetings, dates discussed:    Comments:

## 2012-04-26 NOTE — H&P (Signed)
Tina Wheeler is an 79 y.o. female.    Chief Complaint:   Right leg pain, (tibial plateau fx and fibula fx)  HPI: Pt is a 79 y.o. female complaining of right leg pain since last night.  She was walking down the stairs in her house and believed she had reached the bottom. She then proceed to trip and fall off the last step, causing instant extreme pain in the right leg. She was then brought to the [facility] ER were she says she was diagnosed with a tibia and fibula fracture of the right leg.  She was then transferred to [facility] and Dr. Charlann Boxer was consulted.  She has had previous hip surgery by Dr. Lequita Halt and prefers him. A discussion was had about further evaluation of the fractures and the potential for surgery based on the findings.    PCP:  No primary provider on file.  PMH: Diagnosis  . Hypertension  . Thyroid  . Detached right retina    PSH: Surgery  . Surgery to remove gall stones  . Right hip surgery  x4  . Surgery for detached right retina    Social History:  reports that she quit smoking about 7 months ago. Her smoking use included Cigarettes. She has never used smokeless tobacco. She reports that she does not drink alcohol. Her drug history not on file.  Medications: Patient is unsure of her medications at this time. Pharmacy is working on finding out the medications and correct dosing.  Allergies:  NKDA  Medications: Current Facility-Administered Medications  Medication Dose Route Frequency Provider Last Rate Last Dose  . alum & mag hydroxide-simeth (MAALOX/MYLANTA) 200-200-20 MG/5ML suspension 30 mL  30 mL Oral Q6H PRN Genelle Gather Babish, PA      . bisacodyl (DULCOLAX) suppository 10 mg  10 mg Rectal Daily PRN Genelle Gather Babish, PA      . docusate sodium (COLACE) capsule 100 mg  100 mg Oral BID Genelle Gather [place], PA   100 mg at 04/26/12 0813  . HYDROcodone-acetaminophen (NORCO/VICODIN) 5-325 MG per tablet 1-2 tablet  1-2 tablet Oral Q4H  PRN Gerrit Halls, PA   1 tablet at 04/26/12 1016  . HYDROmorphone (DILAUDID) injection 0.5-1 mg  0.5-1 mg Intravenous Q2H PRN Genelle Gather Babish, PA   0.5 mg at 04/26/12 0037  . ondansetron (ZOFRAN) tablet 4 mg  4 mg Oral Q6H PRN Genelle Gather Babish, PA       Or  . ondansetron [facility]) injection 4 mg  4 mg Intravenous Q6H PRN Genelle Gather Babish, PA      . polyethylene glycol (MIRALAX / GLYCOLAX) packet 17 g  17 g Oral Daily PRN Genelle Gather Babish, PA      . sodium chloride 0.9 % 1,000 mL with potassium chloride 10 mEq infusion   Intravenous Continuous Genelle Gather [place], PA 50 mL/hr at 04/26/12 0800    . zolpidem (AMBIEN) tablet 5 mg  5 mg Oral QHS PRN Gerrit Halls, PA   5 mg at 04/25/12 2258    Results for orders placed during the hospital encounter of 04/25/12 (from the past 48 hour(s))  BASIC METABOLIC PANEL     Status: Abnormal   Collection Time   04/26/12  4:07 AM      Component Value Range Comment   Sodium 128 (*) 135 - 145 mEq/L    Potassium 4.3  3.5 - 5.1 mEq/L    Chloride 96  96 - 112 mEq/L  CO2 26  19 - 32 mEq/L    Glucose, Bld 118 (*) 70 - 99 mg/dL    BUN 16  6 - 23 mg/dL    Creatinine, Ser 1.61  0.50 - 1.10 mg/dL    Calcium 9.1  8.4 - 09.6 mg/dL    GFR calc non Af Amer 54 (*) >90 mL/min    GFR calc Af Amer 62 (*) >90 mL/min   CBC     Status: Abnormal   Collection Time   04/26/12  4:07 AM      Component Value Range Comment   WBC 4.9  4.0 - 10.5 K/uL    RBC 3.38 (*) 3.87 - 5.11 MIL/uL    Hemoglobin 8.8 (*) 12.0 - 15.0 g/dL    HCT 04.5 (*) 40.9 - 46.0 %    MCV 79.0  78.0 - 100.0 fL    MCH 26.0  26.0 - 34.0 pg    MCHC 33.0  30.0 - 36.0 g/dL    RDW 81.1  91.4 - 78.2 %    Platelets 232  150 - 400 K/uL     Review of Systems  Constitutional: Negative.   HENT: Negative.   Eyes:       Unable to see out of her right eye  Respiratory: Negative.   Cardiovascular: Negative.   Gastrointestinal: Negative.   Genitourinary: Negative.     Musculoskeletal: Positive for joint pain (right knee and lower leg).  Skin: Negative.   Neurological: Negative.   Endo/Heme/Allergies: Negative.   Psychiatric/Behavioral: Negative.      Physical Exam: Physical Exam  Constitutional: She is well-developed, well-nourished, and in no distress.  HENT:  Head: Normocephalic and atraumatic.  Left Ear: External ear normal.  Mouth/Throat: Oropharynx is clear and moist.  Eyes: Pupils are equal, round, and reactive to light.  Neck: Neck supple. No JVD present. No tracheal deviation present. No thyromegaly present.  Cardiovascular: Normal rate, regular rhythm, normal heart sounds and intact distal pulses.   Pulmonary/Chest: Effort normal and breath sounds normal. No respiratory distress. She has no wheezes.  Abdominal: Soft. There is no tenderness. There is no guarding.  Musculoskeletal:       Right knee: She exhibits decreased range of motion, swelling and bony tenderness. She exhibits no ecchymosis, no laceration and no erythema. tenderness found.  Lymphadenopathy:    She has no cervical adenopathy.  Neurological: She is alert.  Skin: Skin is warm and dry.  Psychiatric: Affect normal.     Assessment/Plan Assessment:     Right leg pain, (tibial plateau fx and fibula fx)  Plan: Further evaluation of the right tib and fib with xray and CT Possible surgery on Saturday depending on findings WIll make her NPO after midnight in antipation of surgery Case will be discussed with Dr. Lequita Halt At this time she has a regular diet and her pain is well controlled with medication. Maintain current long leg splint    Anastasio Auerbach. Babish   PAC  04/26/2012, 10:28 AM

## 2012-04-27 ENCOUNTER — Inpatient Hospital Stay (HOSPITAL_COMMUNITY): Payer: Medicare Other | Admitting: Anesthesiology

## 2012-04-27 ENCOUNTER — Inpatient Hospital Stay (HOSPITAL_COMMUNITY): Payer: Medicare Other

## 2012-04-27 ENCOUNTER — Encounter (HOSPITAL_COMMUNITY)
Admission: AD | Disposition: A | Discharge status: Skilled Nursing Facility | Payer: Self-pay | Source: Other Acute Inpatient Hospital | Attending: Orthopedic Surgery

## 2012-04-27 ENCOUNTER — Encounter (HOSPITAL_COMMUNITY): Payer: Self-pay | Admitting: Anesthesiology

## 2012-04-27 DIAGNOSIS — S82143A Displaced bicondylar fracture of unspecified tibia, initial encounter for closed fracture: Secondary | ICD-10-CM | POA: Diagnosis present

## 2012-04-27 HISTORY — PX: ORIF TIBIA PLATEAU: SHX2132

## 2012-04-27 LAB — CBC
HCT: 25.1 % — ABNORMAL LOW (ref 36.0–46.0)
Hemoglobin: 8.3 g/dL — ABNORMAL LOW (ref 12.0–15.0)
MCHC: 33.1 g/dL (ref 30.0–36.0)
RBC: 3.15 MIL/uL — ABNORMAL LOW (ref 3.87–5.11)

## 2012-04-27 LAB — TYPE AND SCREEN: Antibody Screen: NEGATIVE

## 2012-04-27 LAB — SURGICAL PCR SCREEN
MRSA, PCR: NEGATIVE
Staphylococcus aureus: POSITIVE — AB

## 2012-04-27 LAB — BASIC METABOLIC PANEL
BUN: 16 mg/dL (ref 6–23)
Chloride: 96 mEq/L (ref 96–112)
GFR calc Af Amer: 72 mL/min — ABNORMAL LOW (ref >90)
Potassium: 4.5 mEq/L (ref 3.5–5.1)
Sodium: 129 mEq/L — ABNORMAL LOW (ref 135–145)

## 2012-04-27 SURGERY — OPEN REDUCTION INTERNAL FIXATION (ORIF) TIBIAL PLATEAU
Anesthesia: General | Laterality: Right | Wound class: Clean

## 2012-04-27 MED ORDER — PROPOFOL 10 MG/ML IV BOLUS
INTRAVENOUS | Status: DC | PRN
  Administered 2012-04-27: 100 mg via INTRAVENOUS

## 2012-04-27 MED ORDER — POLYETHYLENE GLYCOL 3350 17 G PO PACK: 17.0000 g | PACK | Freq: Every day | ORAL | Status: DC | PRN

## 2012-04-27 MED ORDER — ENOXAPARIN SODIUM 40 MG/0.4ML ~~LOC~~ SOLN
40.0000 mg | SUBCUTANEOUS | Status: DC
  Administered 2012-04-28 – 2012-04-30 (×3): 40 mg via SUBCUTANEOUS
  Filled 2012-04-27 (×4): qty 0.4

## 2012-04-27 MED ORDER — LIDOCAINE HCL (CARDIAC) 20 MG/ML IV SOLN
INTRAVENOUS | Status: DC | PRN
  Administered 2012-04-27: 50 mg via INTRAVENOUS

## 2012-04-27 MED ORDER — ROCURONIUM BROMIDE 100 MG/10ML IV SOLN
INTRAVENOUS | Status: DC | PRN
  Administered 2012-04-27: 30 mg via INTRAVENOUS

## 2012-04-27 MED ORDER — SODIUM CHLORIDE 0.9 % IV SOLN: INTRAVENOUS | Status: DC

## 2012-04-27 MED ORDER — ONDANSETRON HCL 4 MG/2ML IJ SOLN
INTRAMUSCULAR | Status: DC | PRN
  Administered 2012-04-27: 4 mg via INTRAVENOUS

## 2012-04-27 MED ORDER — DOCUSATE SODIUM 100 MG PO CAPS: 100.0000 mg | ORAL_CAPSULE | Freq: Two times a day (BID) | ORAL | Status: DC

## 2012-04-27 MED ORDER — SODIUM CHLORIDE 0.9 % IV SOLN
INTRAVENOUS | Status: DC
  Administered 2012-04-27 – 2012-04-28 (×2): via INTRAVENOUS

## 2012-04-27 MED ORDER — CEFAZOLIN SODIUM-DEXTROSE 2-3 GM-% IV SOLR
2.0000 g | Freq: Once | INTRAVENOUS | Status: AC
  Administered 2012-04-27: 2 g via INTRAVENOUS
  Filled 2012-04-27: qty 50

## 2012-04-27 MED ORDER — PHENYLEPHRINE HCL 10 MG/ML IJ SOLN
INTRAMUSCULAR | Status: DC | PRN
  Administered 2012-04-27: 40 ug via INTRAVENOUS

## 2012-04-27 MED ORDER — FENTANYL CITRATE 0.05 MG/ML IJ SOLN
INTRAMUSCULAR | Status: DC | PRN
  Administered 2012-04-27 (×3): 50 ug via INTRAVENOUS

## 2012-04-27 MED ORDER — BISACODYL 10 MG RE SUPP: 10.0000 mg | Freq: Every day | RECTAL | Status: DC | PRN

## 2012-04-27 MED ORDER — FLEET ENEMA 7-19 GM/118ML RE ENEM: 1.0000 | ENEMA | Freq: Once | RECTAL | Status: AC | PRN

## 2012-04-27 MED ORDER — METHOCARBAMOL 500 MG PO TABS
500.0000 mg | ORAL_TABLET | Freq: Four times a day (QID) | ORAL | Status: DC | PRN
  Administered 2012-04-28 – 2012-04-29 (×2): 500 mg via ORAL
  Filled 2012-04-27 (×2): qty 1

## 2012-04-27 MED ORDER — CEFAZOLIN SODIUM 1-5 GM-% IV SOLN
1.0000 g | Freq: Four times a day (QID) | INTRAVENOUS | Status: AC
  Administered 2012-04-27 – 2012-04-28 (×3): 1 g via INTRAVENOUS
  Filled 2012-04-27 (×3): qty 50

## 2012-04-27 MED ORDER — METOCLOPRAMIDE HCL 5 MG/ML IJ SOLN: 5.0000 mg | Freq: Three times a day (TID) | INTRAMUSCULAR | Status: DC | PRN

## 2012-04-27 MED ORDER — TRAMADOL HCL 50 MG PO TABS: 50.0000 mg | ORAL_TABLET | Freq: Four times a day (QID) | ORAL | Status: DC | PRN

## 2012-04-27 MED ORDER — METHOCARBAMOL 100 MG/ML IJ SOLN
500.0000 mg | Freq: Four times a day (QID) | INTRAVENOUS | Status: DC | PRN
  Filled 2012-04-27: qty 5

## 2012-04-27 MED ORDER — 0.9 % SODIUM CHLORIDE (POUR BTL) OPTIME
TOPICAL | Status: DC | PRN
  Administered 2012-04-27: 1000 mL

## 2012-04-27 MED ORDER — SODIUM CHLORIDE 0.9 % IV SOLN
INTRAVENOUS | Status: DC | PRN
  Administered 2012-04-27: 10:00:00 via INTRAVENOUS

## 2012-04-27 MED ORDER — ONDANSETRON HCL 4 MG/2ML IJ SOLN: 4.0000 mg | Freq: Four times a day (QID) | INTRAMUSCULAR | Status: DC | PRN

## 2012-04-27 MED ORDER — GLYCOPYRROLATE 0.2 MG/ML IJ SOLN
INTRAMUSCULAR | Status: DC | PRN
  Administered 2012-04-27: .7 mg via INTRAVENOUS

## 2012-04-27 MED ORDER — HYDROMORPHONE HCL PF 1 MG/ML IJ SOLN
INTRAMUSCULAR | Status: AC
  Filled 2012-04-27: qty 2

## 2012-04-27 MED ORDER — MORPHINE SULFATE 2 MG/ML IJ SOLN
1.0000 mg | INTRAMUSCULAR | Status: DC | PRN
  Administered 2012-04-27 – 2012-04-28 (×2): 2 mg via INTRAVENOUS
  Filled 2012-04-27 (×2): qty 1

## 2012-04-27 MED ORDER — OXYCODONE HCL 5 MG PO TABS
5.0000 mg | ORAL_TABLET | ORAL | Status: DC | PRN
  Administered 2012-04-28 – 2012-04-30 (×6): 10 mg via ORAL
  Filled 2012-04-27 (×6): qty 2

## 2012-04-27 MED ORDER — CEFAZOLIN SODIUM 1-5 GM-% IV SOLN: 1.0000 g | Freq: Once | INTRAVENOUS | Status: DC

## 2012-04-27 MED ORDER — ONDANSETRON HCL 4 MG PO TABS: 4.0000 mg | ORAL_TABLET | Freq: Four times a day (QID) | ORAL | Status: DC | PRN

## 2012-04-27 MED ORDER — METOCLOPRAMIDE HCL 10 MG PO TABS: 5.0000 mg | ORAL_TABLET | Freq: Three times a day (TID) | ORAL | Status: DC | PRN

## 2012-04-27 MED ORDER — NEOSTIGMINE METHYLSULFATE 1 MG/ML IJ SOLN
INTRAMUSCULAR | Status: DC | PRN
  Administered 2012-04-27: 4 mg via INTRAVENOUS

## 2012-04-27 MED ORDER — LACTATED RINGERS IV SOLN: INTRAVENOUS | Status: DC

## 2012-04-27 MED ORDER — HYDROMORPHONE HCL PF 1 MG/ML IJ SOLN
0.2500 mg | INTRAMUSCULAR | Status: DC | PRN
  Administered 2012-04-27 (×4): 0.5 mg via INTRAVENOUS

## 2012-04-27 SURGICAL SUPPLY — 62 items
BAG ZIPLOCK 12X15 (MISCELLANEOUS) ×2 IMPLANT
BANDAGE ELASTIC 6 VELCRO ST LF (GAUZE/BANDAGES/DRESSINGS) ×2 IMPLANT
BANDAGE ESMARK 6X9 LF (GAUZE/BANDAGES/DRESSINGS) ×1 IMPLANT
BIT DRILL 100X2.5XANTM LCK (BIT) ×1 IMPLANT
BIT DRILL CAL (BIT) ×1 IMPLANT
BIT DRILL SLEEVE MEASURING (BIT) ×1 IMPLANT
BIT DRL 100X2.5XANTM LCK (BIT) ×1
BNDG ESMARK 6X9 LF (GAUZE/BANDAGES/DRESSINGS) ×2
CLOTH BEACON ORANGE TIMEOUT ST (SAFETY) ×2 IMPLANT
CUFF TOURN SGL QUICK 34 (TOURNIQUET CUFF) ×1
CUFF TRNQT CYL 34X4X40X1 (TOURNIQUET CUFF) ×1 IMPLANT
DRAPE C-ARM 42X72 X-RAY (DRAPES) ×2 IMPLANT
DRAPE LG THREE QUARTER DISP (DRAPES) ×4 IMPLANT
DRAPE POUCH INSTRU U-SHP 10X18 (DRAPES) ×2 IMPLANT
DRAPE U-SHAPE 47X51 STRL (DRAPES) ×2 IMPLANT
DRILL BIT 2.5MM (BIT) ×1
DRILL BIT CAL (BIT) ×2
DRILL SLEEVE MEASURING (BIT) ×2
DRSG ADAPTIC 3X8 NADH LF (GAUZE/BANDAGES/DRESSINGS) ×2 IMPLANT
DRSG EMULSION OIL 3X16 NADH (GAUZE/BANDAGES/DRESSINGS) ×2 IMPLANT
DRSG PAD ABDOMINAL 8X10 ST (GAUZE/BANDAGES/DRESSINGS) ×2 IMPLANT
DURAPREP 26ML APPLICATOR (WOUND CARE) ×2 IMPLANT
ELECT REM PT RETURN 9FT ADLT (ELECTROSURGICAL) ×2
ELECTRODE REM PT RTRN 9FT ADLT (ELECTROSURGICAL) ×1 IMPLANT
GLOVE BIO SURGEON STRL SZ7.5 (GLOVE) ×2 IMPLANT
GLOVE BIO SURGEON STRL SZ8 (GLOVE) ×2 IMPLANT
GLOVE BIOGEL PI IND STRL 8 (GLOVE) ×2 IMPLANT
GLOVE BIOGEL PI INDICATOR 8 (GLOVE) ×2
GOWN STRL NON-REIN LRG LVL3 (GOWN DISPOSABLE) ×2 IMPLANT
GOWN STRL REIN XL XLG (GOWN DISPOSABLE) ×2 IMPLANT
IMMOBILIZER KNEE 20 (SOFTGOODS)
IMMOBILIZER KNEE 20 THIGH 36 (SOFTGOODS) IMPLANT
IMMOBILIZER KNEE 22 UNIV (SOFTGOODS) ×2 IMPLANT
KIT BASIN OR (CUSTOM PROCEDURE TRAY) ×2 IMPLANT
MANIFOLD NEPTUNE II (INSTRUMENTS) ×2 IMPLANT
NS IRRIG 1000ML POUR BTL (IV SOLUTION) ×2 IMPLANT
PACK TOTAL JOINT (CUSTOM PROCEDURE TRAY) ×2 IMPLANT
PAD CAST 4YDX4 CTTN HI CHSV (CAST SUPPLIES) ×2 IMPLANT
PADDING CAST ABS 6INX4YD NS (CAST SUPPLIES) ×1
PADDING CAST ABS COTTON 6X4 NS (CAST SUPPLIES) ×1 IMPLANT
PADDING CAST COTTON 4X4 STRL (CAST SUPPLIES) ×2
PLATE LOCK 5H STD RT PROX TIB (Plate) ×2 IMPLANT
POSITIONER SURGICAL ARM (MISCELLANEOUS) ×2 IMPLANT
SCREW CORTICAL 3.5MM  34MM (Screw) ×2 IMPLANT
SCREW CORTICAL 3.5MM 34MM (Screw) ×2 IMPLANT
SCREW CORTICAL 3.5MM 36MM (Screw) ×2 IMPLANT
SCREW LOCK CORT STAR 3.5X60 (Screw) ×4 IMPLANT
SCREW LOCK CORT STAR 3.5X65 (Screw) ×2 IMPLANT
SCREW LOCK CORT STAR 3.5X75 (Screw) ×4 IMPLANT
SCREW LP 3.5X75MM (Screw) ×2 IMPLANT
SPONGE GAUZE 4X4 12PLY (GAUZE/BANDAGES/DRESSINGS) ×2 IMPLANT
SPONGE LAP 18X18 X RAY DECT (DISPOSABLE) ×4 IMPLANT
STOCKINETTE 8 INCH (MISCELLANEOUS) ×2 IMPLANT
STRIP CLOSURE SKIN 1/2X4 (GAUZE/BANDAGES/DRESSINGS) ×4 IMPLANT
SUCTION FRAZIER TIP 10 FR DISP (SUCTIONS) ×2 IMPLANT
SUT ETHILON 4 0 PS 2 18 (SUTURE) IMPLANT
SUT MNCRL AB 4-0 PS2 18 (SUTURE) ×2 IMPLANT
SUT VIC AB 1 CT1 27 (SUTURE) ×2
SUT VIC AB 1 CT1 27XBRD ANTBC (SUTURE) ×2 IMPLANT
SUT VIC AB 2-0 CT2 27 (SUTURE) ×6 IMPLANT
TOWEL OR 17X26 10 PK STRL BLUE (TOWEL DISPOSABLE) ×4 IMPLANT
WATER STERILE IRR 1500ML POUR (IV SOLUTION) ×2 IMPLANT

## 2012-04-27 NOTE — Anesthesia Preprocedure Evaluation (Addendum)
Anesthesia Evaluation  Patient identified by MRN, date of birth, ID band Patient awake    Reviewed: Allergy & Precautions, H&P , NPO status , Patient's Chart, lab work & pertinent test results  Airway Mallampati: II TM Distance: >3 FB Neck ROM: full    Dental  (+) Edentulous Upper, Edentulous Lower and Dental Advisory Given   Pulmonary neg pulmonary ROS,  breath sounds clear to auscultation  Pulmonary exam normal       Cardiovascular hypertension, Pt. on medications Rhythm:regular Rate:Normal  ECG trigeminy   Neuro/Psych negative neurological ROS  negative psych ROS   GI/Hepatic negative GI ROS, Neg liver ROS,   Endo/Other  negative endocrine ROS  Renal/GU negative Renal ROS  negative genitourinary   Musculoskeletal   Abdominal   Peds  Hematology negative hematology ROS (+) Blood dyscrasia, anemia , Hgb 8.3   Anesthesia Other Findings   Reproductive/Obstetrics negative OB ROS                         Anesthesia Physical Anesthesia Plan  ASA: II  Anesthesia Plan: General   Post-op Pain Management:    Induction: Intravenous  Airway Management Planned: Oral ETT  Additional Equipment:   Intra-op Plan:   Post-operative Plan: Extubation in OR  Informed Consent: I have reviewed the patients History and Physical, chart, labs and discussed the procedure including the risks, benefits and alternatives for the proposed anesthesia with the patient or authorized representative who has indicated his/her understanding and acceptance.   Dental Advisory Given  Plan Discussed with: CRNA and Surgeon  Anesthesia Plan Comments:         Anesthesia Quick Evaluation

## 2012-04-27 NOTE — Transfer of Care (Signed)
Immediate Anesthesia Transfer of Care Note  Patient: Tina Wheeler  Procedure(s) Performed: Procedure(s) (LRB): OPEN REDUCTION INTERNAL FIXATION (ORIF) TIBIAL PLATEAU (Right)  Patient Location: PACU  Anesthesia Type: General  Level of Consciousness: sedated, patient cooperative and responds to stimulaton  Airway & Oxygen Therapy: Patient Spontanous Breathing and Patient connected to face mask oxgen  Post-op Assessment: Report given to PACU RN and Post -op Vital signs reviewed and stable  Post vital signs: Reviewed and stable  Complications: No apparent anesthesia complications

## 2012-04-27 NOTE — Brief Op Note (Signed)
04/25/2012 - 04/27/2012  12:01 PM  PATIENT:  Tina Wheeler  79 y.o. female  PRE-OPERATIVE DIAGNOSIS:  right tibial plateau fracture  POST-OPERATIVE DIAGNOSIS:  right tibial plateau fracture  PROCEDURE:  Procedure(s): OPEN REDUCTION INTERNAL FIXATION (ORIF) TIBIAL PLATEAU (Right)  SURGEON:  Surgeon(s) and Role:    * Loanne Drilling, MD - Primary  PHYSICIAN ASSISTANT:   ASSISTANTS: Avel Peace, PA-C   ANESTHESIA:   general  EBL:  Total I/O In: 0  Out: 250 [Urine:250]  BLOOD ADMINISTERED:none  DRAINS: none   LOCAL MEDICATIONS USED:  NONE  COUNTS:  YES  TOURNIQUET:   Total Tourniquet Time Documented: Thigh (Right) - 33 minutes Total: Thigh (Right) - 33 minutes   DICTATION: .Other Dictation: Dictation Number [id number]  PLAN OF CARE: Admit to inpatient   PATIENT DISPOSITION:  PACU - hemodynamically stable.

## 2012-04-27 NOTE — Interval H&P Note (Signed)
History and Physical Interval Note:  04/27/2012 10:02 AM  Tina Wheeler  has presented today for surgery, with the diagnosis of right tibial plateau fracture  The various methods of treatment have been discussed with the patient and family. After consideration of risks, benefits and other options for treatment, the patient has consented to  Procedure(s): OPEN REDUCTION INTERNAL FIXATION (ORIF) TIBIAL PLATEAU (Right) as a surgical intervention .  The patient's history has been reviewed, patient examined, no change in status, stable for surgery.  I have reviewed the patient's chart and labs.  Questions were answered to the patient's satisfaction.     Loanne Drilling

## 2012-04-27 NOTE — Anesthesia Postprocedure Evaluation (Signed)
  Anesthesia Post-op Note  Patient: Tina Wheeler  Procedure(s) Performed: Procedure(s) (LRB): OPEN REDUCTION INTERNAL FIXATION (ORIF) TIBIAL PLATEAU (Right)  Patient Location: PACU  Anesthesia Type: General  Level of Consciousness: awake and alert   Airway and Oxygen Therapy: Patient Spontanous Breathing  Post-op Pain: mild  Post-op Assessment: Post-op Vital signs reviewed, Patient's Cardiovascular Status Stable, Respiratory Function Stable, Patent Airway and No signs of Nausea or vomiting  Last Vitals:  Filed Vitals:   04/27/12 1305  BP:   Pulse: 79  Temp:   Resp: 12    Post-op Vital Signs: stable   Complications: No apparent anesthesia complications

## 2012-04-27 NOTE — Op Note (Signed)
Tina Wheeler, Tina Wheeler                  ACCOUNT NO.:  [id number]  MEDICAL RECORD NO.:  [id number]  LOCATION:  [facility]                         FACILITY:  [facility]  PHYSICIAN:  Ollen Gross, M.D.    DATE OF BIRTH:  [birth year]  DATE OF PROCEDURE:  04/27/2012 DATE OF DISCHARGE:                              OPERATIVE REPORT   PREOPERATIVE DIAGNOSIS:  Right tibial plateau fracture.  POSTOPERATIVE DIAGNOSIS:  Right tibial plateau fracture.  PROCEDURE:  Open reduction and internal fixation of right tibial plateau fracture.  SURGEON:  Ollen Gross, M.D.  ASSISTANT:  Alexzandrew L. Perkins, P.A.C.  ANESTHESIA:  General.  ESTIMATED BLOOD LOSS:  Minimal.  DRAINS:  None.  TOURNIQUET TIME:  32 minutes at 300 mmHg.  COMPLICATIONS:  None.  CONDITION:  Stable to recovery.  BRIEF CLINICAL NOTE:  Tina Wheeler is a 79 year old female who was walking down her stairs, missed the bottom step two nights ago, slipped and fell, sustaining immediate pain in the right leg.  Radiographs demonstrated tibial plateau fracture mainly at the metaphysis diaphyseal junction.  CT scan confirmed this.  She presents now for open reduction and internal fixation.  PROCEDURE IN DETAIL:  After successful administration of general anesthetic, tourniquet was placed high on the right thigh and right lower extremity, prepped and draped in the usual sterile fashion. Extremity was wrapped in an Esmarch and tourniquet inflated to 300 mmHg. An incision was made, a hockey stick type incision starting around the fibular head, coursing horizontally at the tibial tubercle and going distal in vertical for about 6 inches.  The skin was cut with a #10 blade through subcutaneous tissue to the periosteum.  The periosteum was incised and elevated the fractures at the metaphysis diaphyseal junction.  We used the AOP Biomet locking plate for the tibial plateau. We placed this over the bone and under fluoroscopic guidance, it showed that  it fit well along the lateral context and contour of the bone.  The proximal 2 holes were then utilized drilling through the hole and then placing a bicortical screw, first in a locking screw and second through the proximal row.  Third screw was then placed in the proximal row and a fourth screw both locking in nature placed through the second row which showed good purchase in the bone proximal to the fracture.  We then reduced the fracture and then placed a screw at the distal most hole of the plate bicortical in nature which effectively maintained the alignment of the fracture and anatomic alignment in the AP, and there was just slight posterior slope on the lateral.  Two kick stand screws were then placed on both locking screws and then two more bicortical screws were placed distally.  We had excellent purchase on all of the screws and once completed, then the wound was copiously irrigated with saline solution.  AP lateral and oblique fluoroscopic views were taken showing excellent reduction of this fracture.  Excellent alignment of the tibia.  The tourniquet was then released at total time of 32 minutes.  We attempted to repair some of the periosteum to cover the plate.  This was done with #1 Vicryl.  Subcu was  then closed with interrupted 2-0 Vicryl and subcuticular running 4-0 Monocryl.  Incisions were cleaned and dried, and Steri-Strips and a bulky sterile dressing applied.  She was placed into a knee immobilizer, awakened, and transported to the recovery in stable condition.     Ollen Gross, M.D.     FA/MEDQ  D:  04/27/2012  T:  04/27/2012  Job:  [id number]  cc:   Alexzandrew L. Julien Girt, P.A.C.

## 2012-04-27 NOTE — Progress Notes (Signed)
Subjective: Patient complains of minor pain right leg   Objective: Vital signs in last 24 hours: Temp:  [98.3 F (36.8 C)-99 F (37.2 C)] 98.3 F (36.8 C) (02/08 0616) Pulse Rate:  [84-88] 88 (02/08 0616) Resp:  [16] 16 (02/08 0616) BP: (108-129)/(61-70) 108/61 mmHg (02/08 0616) SpO2:  [95 %-96 %] 95 % (02/08 0616)  Intake/Output from previous day: 02/07 0701 - 02/08 0700 In: 360 [P.O.:360] Out: 1350 [Urine:1350] Intake/Output this shift: Total I/O In: 0  Out: 150 [Urine:150]   Recent Labs  04/26/12 0407 04/27/12 0417  HGB 8.8* 8.3*    Recent Labs  04/26/12 0407 04/27/12 0417  WBC 4.9 4.8  RBC 3.38* 3.15*  HCT 26.7* 25.1*  PLT 232 201    Recent Labs  04/26/12 0407 04/27/12 0417  NA 128* 129*  K 4.3 4.5  CL 96 96  CO2 26 26  BUN 16 16  CREATININE 0.98 0.87  GLUCOSE 118* 119*  CALCIUM 9.1 8.9   No results found for this basename: LABPT, INR,  in the last 72 hours  Neurologically intact Neurovascular intact Sensation intact distally Compartment soft CT and xrays reviewed and she has a metaphyseal/tibial plateau fracture which is unstable  Assessment/Plan: Tibial plateau fracture- Plan ORIF today. Discussed procedure risks and potential complications with the patient who elects to proceed.   Loanne Drilling 04/27/2012, 8:17 AM

## 2012-04-28 LAB — CBC
HCT: 25.5 % — ABNORMAL LOW (ref 36.0–46.0)
MCV: 80.2 fL (ref 78.0–100.0)
Platelets: 198 10*3/uL (ref 150–400)
RBC: 3.18 MIL/uL — ABNORMAL LOW (ref 3.87–5.11)
WBC: 7 10*3/uL (ref 4.0–10.5)

## 2012-04-28 LAB — BASIC METABOLIC PANEL
CO2: 26 mEq/L (ref 19–32)
Calcium: 8.6 mg/dL (ref 8.4–10.5)
Chloride: 97 mEq/L (ref 96–112)
Sodium: 128 mEq/L — ABNORMAL LOW (ref 135–145)

## 2012-04-28 MED ORDER — PREDNISOLONE ACETATE 0.12 % OP SUSP
1.0000 [drp] | Freq: Two times a day (BID) | OPHTHALMIC | Status: DC
  Administered 2012-04-28 – 2012-04-30 (×4): 1 [drp] via OPHTHALMIC

## 2012-04-28 NOTE — Evaluation (Signed)
Physical Therapy Evaluation Patient Details Name: Tina Wheeler MRN: [id number] DOB: Apr 02, 1931 Today's Date: 04/28/2012 Time: 2951-8841 PT Time Calculation (min): 29 min  PT Assessment / Plan / Recommendation Clinical Impression  79 y.o. female admitted with R tibial plateau fx, R fibular head fx, and R lateral maleolus fx. Sit to stand with +2 total assist, pt SOB and HR up to 131 with standing, returned to bed. SNF recommended. Pt would benefit from acute PT to maximize safety and independence with mobility.     PT Assessment  Patient needs continued PT services    Follow Up Recommendations  SNF    Does the patient have the potential to tolerate intense rehabilitation      Barriers to Discharge Decreased caregiver support pt lives with daughter and SIL who work    Equipment Recommendations  None recommended by PT    Recommendations for Other Services OT consult   Frequency Min 5X/week    Precautions / Restrictions Precautions Required Braces or Orthoses: Knee Immobilizer - Right;Other Brace/Splint (ankle brace; ok to do R knee ROM) Other Brace/Splint: ankle brace Restrictions Weight Bearing Restrictions: Yes RLE Weight Bearing: Touchdown weight bearing   Pertinent Vitals/Pain **3/10 R knee HR 99 at rest, 131 with sit to stand SaO2 97% on RA*      Mobility  Bed Mobility Bed Mobility: Supine to Sit Supine to Sit: 2: Max assist Supine to Sit: Patient Percentage: 60% Details for Bed Mobility Assistance: assist for RLE Transfers Transfers: Sit to Stand;Stand to Sit Sit to Stand: 1: +2 Total assist Sit to Stand: Patient Percentage: 30% Stand to Sit: 1: +2 Total assist Stand to Sit: Patient Percentage: 30% Details for Transfer Assistance: pt stood for 1 minute with RW, pt unable to achieve full upright position, trunk flexed. Unable to weight shift. Pt SOB, HR up to 131 so returned to bed.  Ambulation/Gait Ambulation/Gait Assistance: Not tested (comment)     Exercises     PT Diagnosis: Difficulty walking;Acute pain;Generalized weakness  PT Problem List: Decreased activity tolerance;Decreased strength;Decreased knowledge of precautions;Decreased mobility;Decreased knowledge of use of DME;Pain PT Treatment Interventions: DME instruction;Gait training;Functional mobility training;Therapeutic exercise;Therapeutic activities;Patient/family education   PT Goals Acute Rehab PT Goals PT Goal Formulation: With patient Time For Goal Achievement: 05/12/12 Potential to Achieve Goals: Fair Pt will go Supine/Side to Sit: with min assist PT Goal: Supine/Side to Sit - Progress: Goal set today Pt will go Sit to Stand: with min assist PT Goal: Sit to Stand - Progress: Goal set today Pt will go Stand to Sit: with min assist PT Goal: Stand to Sit - Progress: Goal set today Pt will Transfer Bed to Chair/Chair to Bed: with min assist PT Transfer Goal: Bed to Chair/Chair to Bed - Progress: Goal set today Pt will Ambulate: 1 - 15 feet;with min assist PT Goal: Ambulate - Progress: Goal set today  Visit Information  Last PT Received On: 04/28/12 Assistance Needed: +2 PT/OT Co-Evaluation/Treatment: Yes Reason Eval/Treat Not Completed: Medical issues which prohibited therapy (HR up to 131 with sit to stand)    Subjective Data  Subjective: It doesn't hurt much. Patient Stated Goal: to walk   Prior Functioning  Home Living Lives With: Daughter Available Help at Discharge: Family;Available PRN/intermittently (daughter and son in law work) Type of Home: House Home Access: Stairs to enter Secretary/administrator of Steps: 1 Home Layout: One level;Laundry or work area in basement [employer] Shower/Tub: Engineer, manufacturing systems: [employer] Equipment: Bedside commode/3-in-1;Shower chair without  back;Walker - rolling Prior Function Level of Independence: Independent with assistive device(s);Needs assistance (used quad cane) Needs Assistance:  Light Housekeeping Communication Communication: No difficulties    Cognition  Cognition Overall Cognitive Status: Appears within functional limits for tasks assessed/performed Arousal/Alertness: Awake/alert Orientation Level: Appears intact for tasks assessed Behavior During Session: [facility] for tasks performed    Extremity/Trunk Assessment Right Upper Extremity Assessment RUE ROM/Strength/Tone: [facility] for tasks assessed Left Upper Extremity Assessment LUE ROM/Strength/Tone: [facility] for tasks assessed Right Lower Extremity Assessment RLE ROM/Strength/Tone: Deficits;Due to precautions;Unable to fully assess;Due to pain RLE ROM/Strength/Tone Deficits: R ankle with aircast, R knee in KI; pt tolerated hip ROM well RLE Sensation: WFL - Light Touch RLE Coordination: WFL - gross/fine motor Left Lower Extremity Assessment LLE ROM/Strength/Tone: Within functional levels LLE Sensation: WFL - Light Touch LLE Coordination: WFL - gross/fine motor Trunk Assessment Trunk Assessment: Kyphotic   Balance Balance Balance Assessed: Yes Static Sitting Balance Static Sitting - Level of Assistance: 5: Stand by assistance Static Sitting - Comment/# of Minutes: 5  End of Session PT - End of Session Equipment Utilized During Treatment: Gait belt;Right knee immobilizer;Other (comment) (R ankle aircast) Activity Tolerance: Treatment limited secondary to medical complications (Comment) Patient left: in bed;with call bell/phone within reach Nurse Communication: Mobility status;Other (comment) (increased HR)  GP     Ralene Bathe Kistler 04/28/2012, 11:46 AM [phone]

## 2012-04-28 NOTE — Evaluation (Signed)
Occupational Therapy Evaluation Patient Details Name: Tina Wheeler MRN: [id number] DOB: Aug 26, 1931 Today's Date: 04/28/2012 Time: 7829-5621 OT Time Calculation (min): 29 min  OT Assessment / Plan / Recommendation Clinical Impression  Pt admitted after falling and sustaining a R tib/fib fx and lateral malleolus.  Pt requires +2 assist for mobility and is dependent in most ADL.  She was limited today by HR in the low 130s with shortness of breath.  Will follow.    OT Assessment  Patient needs continued OT Services    Follow Up Recommendations  SNF    Barriers to Discharge Decreased caregiver support    Equipment Recommendations  None recommended by OT    Recommendations for Other Services    Frequency  Min 2X/week    Precautions / Restrictions Precautions Precautions: Fall Required Braces or Orthoses: Knee Immobilizer - Right;Other Brace/Splint Other Brace/Splint: ankle brace Restrictions Weight Bearing Restrictions: Yes RLE Weight Bearing: Touchdown weight bearing   Pertinent Vitals/Pain 3-4/10 R LE, repositioned, iced    ADL  Eating/Feeding: Independent Where Assessed - Eating/Feeding: Edge of bed Grooming: Wash/dry hands;Wash/dry face;Set up Where Assessed - Grooming: Supported sitting Upper Body Bathing: Minimal assistance Where Assessed - Upper Body Bathing: Supported sitting Lower Body Bathing: +1 Total assistance Where Assessed - Lower Body Bathing: Supported sitting;Supported sit to stand Upper Body Dressing: Minimal assistance Where Assessed - Upper Body Dressing: Supported sitting Lower Body Dressing: +1 Total assistance Where Assessed - Lower Body Dressing: Supported sitting Equipment Used: Knee Immobilizer;Gait belt;Rolling walker Transfers/Ambulation Related to ADLs: Did not transfer pt, HR up to 131, appeared SOB. ADL Comments: Educated pt in TDWB precautions.    OT Diagnosis: Generalized weakness;Acute pain  OT Problem List: Decreased strength;Decreased  activity tolerance;Impaired balance (sitting and/or standing);Decreased knowledge of use of DME or AE;Decreased knowledge of precautions;Obesity;Pain OT Treatment Interventions: Self-care/ADL training;DME and/or AE instruction;Therapeutic activities;Patient/family education   OT Goals Acute Rehab OT Goals OT Goal Formulation: With patient Time For Goal Achievement: 05/12/12 Potential to Achieve Goals: Good ADL Goals Pt Will Perform Grooming: with set-up;Sitting, edge of bed ADL Goal: Grooming - Progress: Goal set today Pt Will Perform Upper Body Bathing: with set-up;Sitting, edge of bed ADL Goal: Upper Body Bathing - Progress: Goal set today Pt Will Perform Upper Body Dressing: with set-up;Sitting, bed ADL Goal: Upper Body Dressing - Progress: Goal set today Pt Will Transfer to Toilet: with mod assist;Stand pivot transfer;3-in-1 ADL Goal: Toilet Transfer - Progress: Goal set today Pt Will Perform Toileting - Hygiene: with mod assist;Sit to stand from 3-in-1/toilet ADL Goal: Toileting - Hygiene - Progress: Goal set today Miscellaneous OT Goals Miscellaneous OT Goal #1: Pt will perform supine to sit at EOB with min assist in prep for ADL. OT Goal: Miscellaneous Goal #1 - Progress: Goal set today  Visit Information  Last OT Received On: 04/28/12 Assistance Needed: +2 PT/OT Co-Evaluation/Treatment: Yes    Subjective Data  Subjective: "I'm going to get up." Patient Stated Goal: Return home with family after ST rehab.   Prior Functioning     Home Living Lives With: Daughter Available Help at Discharge: Family;Available PRN/intermittently Type of Home: House Home Access: Stairs to enter Entrance Stairs-Number of Steps: 1 Home Layout: One level;Laundry or work area in basement [employer] Shower/Tub: Engineer, manufacturing systems: [employer] Equipment: Bedside commode/3-in-1;Shower chair without back;Walker - rolling Prior Function Level of Independence: Independent  with assistive device(s);Needs assistance Needs Assistance: Light Housekeeping Light Housekeeping: Moderate Communication Communication: No difficulties Dominant Hand: Right  Vision/Perception Vision - History Baseline Vision: Wears glasses only for reading Visual History: 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)   Cognition  Cognition Overall Cognitive Status: Appears within functional limits for tasks assessed/performed Arousal/Alertness: Awake/alert Orientation Level: Appears intact for tasks assessed Behavior During Session: [facility] for tasks performed    Extremity/Trunk Assessment Right Upper Extremity Assessment RUE ROM/Strength/Tone: WFL for tasks assessed RUE Coordination: WFL - gross/fine motor Left Upper Extremity Assessment LUE ROM/Strength/Tone: WFL for tasks assessed LUE Coordination: WFL - gross/fine motor  Trunk Assessment Trunk Assessment: Kyphotic     Mobility Bed Mobility Bed Mobility: Supine to Sit;Sitting - Scoot to [place] of Bed;Sit to Supine;Scooting to [facility] Supine to Sit: 2: Max assist Supine to Sit: Patient Percentage: 60% Sitting - Scoot to Edge of Bed: 2: Max assist Sit to Supine: 1: +2 Total assist Sit to Supine: Patient Percentage: 30% Scooting to HOB: 1: +2 Total assist Scooting to [facility]: Patient Percentage: 10% Details for Bed Mobility Assistance: assist for RLE Transfers Transfers: Sit to Stand;Stand to Sit Sit to Stand: 1: +2 Total assist Sit to Stand: Patient Percentage: 30% Stand to Sit: 1: +2 Total assist Stand to Sit: Patient Percentage: 30% Details for Transfer Assistance: pt stood for 1 minute with RW, pt unable to achieve full upright position, trunk flexed. Unable to weight shift. Pt SOB, HR up to 131 so returned to bed.      Exercise     Balance Balance Balance Assessed: Yes Static Sitting Balance Static Sitting - Level of Assistance: 5: Stand by assistance Static Sitting - Comment/# of Minutes: 5   End of Session OT - End of  Session Activity Tolerance: Patient limited by fatigue;Treatment limited secondary to medical complications (Comment) (increased HR) Patient left: in bed;with call bell/phone within reach Nurse Communication:  (HR)  GO     Evern Bio 04/28/2012, 12:25 PM [phone]

## 2012-04-28 NOTE — Progress Notes (Signed)
   Subjective: 1 Day Post-Op Procedure(s) (LRB): OPEN REDUCTION INTERNAL FIXATION (ORIF) TIBIAL PLATEAU (Right) Patient reports pain as 2 on 0-10 scale.   We will start therapy today.  Plan is to go Skilled nursing facility after hospital stay.  Objective: Vital signs in last 24 hours: Temp:  [97.5 F (36.4 C)-100.2 F (37.9 C)] 99.4 F (37.4 C) (02/09 0957) Pulse Rate:  [66-111] 99 (02/09 0957) Resp:  [8-22] 20 (02/09 0957) BP: (99-157)/(47-84) 151/84 mmHg (02/09 0957) SpO2:  [93 %-100 %] 96 % (02/09 0957)  Intake/Output from previous day:  Intake/Output Summary (Last 24 hours) at 04/28/12 1018 Last data filed at 04/28/12 0956  Gross per 24 hour  Intake   2515 ml  Output   1325 ml  Net   1190 ml    Intake/Output this shift: Total I/O In: 240 [P.O.:240] Out: 200 [Urine:200]  Labs:  Recent Labs  04/26/12 0407 04/27/12 0417 04/28/12 0410  HGB 8.8* 8.3* 8.2*    Recent Labs  04/27/12 0417 04/28/12 0410  WBC 4.8 7.0  RBC 3.15* 3.18*  HCT 25.1* 25.5*  PLT 201 198    Recent Labs  04/27/12 0417 04/28/12 0410  NA 129* 128*  K 4.5 4.4  CL 96 97  CO2 26 26  BUN 16 12  CREATININE 0.87 0.74  GLUCOSE 119* 119*  CALCIUM 8.9 8.6   No results found for this basename: LABPT, INR,  in the last 72 hours  EXAM General - Patient is Alert, Appropriate and Oriented Extremity - Neurologically intact Neurovascular intact Compartment soft Dressing - dressing C/D/I Motor Function - intact, moving foot and toes well on exam.    Past Medical History  Diagnosis Date  . Hypertension     Assessment/Plan: 1 Day Post-Op Procedure(s) (LRB): OPEN REDUCTION INTERNAL FIXATION (ORIF) TIBIAL PLATEAU (Right) Principal Problem:   Fracture of tibial plateau, closed   Advance diet Up with therapy Social work consult for SNF  DVT Prophylaxis - Lovenox TDWB RLE D/C O2 and Pulse OX and try on Room Air  Tina Wheeler V 04/28/2012, 10:18 AM

## 2012-04-28 NOTE — Progress Notes (Addendum)
Clinical Social Work Department CLINICAL SOCIAL WORK PLACEMENT NOTE 04/28/2012  Patient:  Tina Wheeler, Tina Wheeler  Account Number:  [id number] Admit date:  04/25/2012  Clinical Social Worker:  Leron Croak, CLINICAL SOCIAL WORKER  Date/time:  04/28/2012 02:01 PM  Clinical Social Work is seeking post-discharge placement for this patient at the following level of care:   SKILLED NURSING   (*CSW will update this form in Epic as items are completed)   04/28/2012  Patient/family provided with [facility] Department of Clinical Social Work's list of facilities offering this level of care within the geographic area requested by the patient (or if unable, by the patient's family).  04/28/2012  Patient/family informed of their freedom to choose among providers that offer the needed level of care, that participate in Medicare, Medicaid or managed care program needed by the patient, have an available bed and are willing to accept the patient.  04/28/2012  Patient/family informed of [facility]' ownership interest in [facility], as well as of the fact that they are under no obligation to receive care at this facility.  PASARR submitted to [employer] on 07/25/2007 PASARR number received from [employer] on 07/25/2007  FL2 transmitted to all facilities in geographic area requested by pt/family on  04/28/2012 FL2 transmitted to all facilities within larger geographic area on 04/28/2012  Patient informed that his/her managed care company has contracts with or will negotiate with  certain facilities, including the following:     Patient/family informed of bed offers received:  04/29/2012 Patient chooses bed at  Physician recommends and patient chooses bed at    Patient to be transferred to  on   Patient to be transferred to facility by   The following physician request were entered in Epic:   Additional Comments:  Jacklynn Lewis, MSW, LCSWA (coverage) Clinical Social Work

## 2012-04-28 NOTE — Progress Notes (Signed)
Clinical Social Work Department BRIEF PSYCHOSOCIAL ASSESSMENT 04/28/2012  Patient:  Tina Wheeler, Tina Wheeler     Account Number:  [id number]     Admit date:  04/25/2012  Clinical Social Worker:  Leron Croak, CLINICAL SOCIAL WORKER  Date/Time:  04/28/2012 01:55 PM  Referred by:  Physician  Date Referred:  04/28/2012 Referred for  SNF Placement   Other Referral:   Interview type:  Patient Other interview type:    PSYCHOSOCIAL DATA Living Status:  WITH ADULT CHILDREN Admitted from facility:   Level of care:   Primary support name:  Zoe Lan Primary support relationship to patient:  CHILD, ADULT Degree of support available:   Pt has good support from family    CURRENT CONCERNS Current Concerns  Post-Acute Placement   Other Concerns:    SOCIAL WORK ASSESSMENT / PLAN CSW met with the Pt at the bedside to discuss need for rehab at d/c. Pt was aware that she would need to complete some short term rehab. Pt was about to begin PT, so CSW confirmed that Pt was agreeable to search in the area where she resides with her daughter. Pt is agreeable and CSW will begin the search for SNF placement.   Assessment/plan status:  Information/Referral to [facility] Other assessment/ plan:   Information/referral to community resources:   CSW does not have a Hydrograph[employer] surveyor for [facility] SNF's however Pt is aware of the [facility]. CSW will do search for that facility.    PATIENT'S/FAMILY'S RESPONSE TO PLAN OF CARE: Pt was appreciative for assistance and will await d/c planning from weekday CSW for d/c.      Leron Croak, LCSWA [employer] [phone]

## 2012-04-29 LAB — BASIC METABOLIC PANEL
CO2: 26 mEq/L (ref 19–32)
Chloride: 94 mEq/L — ABNORMAL LOW (ref 96–112)
Creatinine, Ser: 0.76 mg/dL (ref 0.50–1.10)

## 2012-04-29 NOTE — Progress Notes (Signed)
CSW followed up with pt to discuss that [facility] was able to offer pt a bed.  Pt stated that she had spoken with pt daughter and pt and pt daughter are interested in [facility].  CSW contacted [facility] and sent pt clinicals to facility. Per facility, they will notify CSW tomorrow morning if they are able to extend a bed offer.   Pt expressed her first choice would be [facility] [place] and if they are unable to offer a bed then pt would be agreeable to [facility].  CSW noted MD note of anticipation for discharge tomorrow.   CSW to follow up with pt in am in regard to decision from [facility].  CSW to facilitate pt discharge needs.  Jacklynn Lewis, MSW, LCSWA  (coverage) Clinical Social Work

## 2012-04-29 NOTE — Progress Notes (Signed)
   Subjective: 2 Days Post-Op Procedure(s) (LRB): OPEN REDUCTION INTERNAL FIXATION (ORIF) TIBIAL PLATEAU (Right) Patient reports pain as 2 on 0-10 scale.   Plan is to go Rehab after hospital stay.  Objective: Vital signs in last 24 hours: Temp:  [98.1 F (36.7 C)-99.6 F (37.6 C)] 98.1 F (36.7 C) (02/10 0545) Pulse Rate:  [77-100] 93 (02/10 0545) Resp:  [16-20] 18 (02/10 0545) BP: (143-152)/(54-76) 152/70 mmHg (02/10 0923) SpO2:  [95 %] 95 % (02/10 0545)  Intake/Output from previous day:  Intake/Output Summary (Last 24 hours) at 04/29/12 1310 Last data filed at 04/29/12 1305  Gross per 24 hour  Intake 1633.33 ml  Output   1850 ml  Net -216.67 ml    Intake/Output this shift: Total I/O In: 583.3 [P.O.:480; I.Wheeler.:103.3] Out: 400 [Urine:400]  Labs:  Recent Labs  04/27/12 0417 04/28/12 0410  HGB 8.3* 8.2*    Recent Labs  04/27/12 0417 04/28/12 0410  WBC 4.8 7.0  RBC 3.15* 3.18*  HCT 25.1* 25.5*  PLT 201 198    Recent Labs  04/28/12 0410 04/29/12 0420  NA 128* 126*  K 4.4 4.1  CL 97 94*  CO2 26 26  BUN 12 14  CREATININE 0.74 0.76  GLUCOSE 119* 114*  CALCIUM 8.6 9.0   No results found for this basename: LABPT, INR,  in the last 72 hours  EXAM General - Patient is Alert, Appropriate and Oriented Extremity - Neurologically intact Neurovascular intact Incision: dressing C/D/I No cellulitis present Compartment soft Dressing/Incision - clean, dry, no drainage Motor Function - intact, moving foot and toes well on exam.   Past Medical History  Diagnosis Date  . Hypertension     Assessment/Plan: 2 Days Post-Op Procedure(s) (LRB): OPEN REDUCTION INTERNAL FIXATION (ORIF) TIBIAL PLATEAU (Right) Principal Problem:   Fracture of tibial plateau, closed   Up with therapy D/C IV fluids Plan for discharge tomorrow  DVT Prophylaxis - Xarelto TDWB to right leg  Tina Wheeler 04/29/2012, 1:10 PM

## 2012-04-29 NOTE — Clinical Documentation Improvement (Signed)
Anemia Blood Loss Clarification  THIS DOCUMENT IS NOT A PERMANENT PART OF THE MEDICAL RECORD  RESPOND TO THE THIS QUERY, FOLLOW THE INSTRUCTIONS BELOW:  1. If needed, update documentation for the patient's encounter via the notes activity.  2. Access this query again and click edit on the In [employer].  3. After updating, or not, click F2 to complete all highlighted (required) fields concerning your review. Select "additional documentation in the medical record" OR "no additional documentation provided".  4. Click Sign note button.  5. The deficiency will fall out of your In Basket *Please let us know if you are not able to complete this workflow by phone or e-mail (listed below).        04/29/12  Dear Dr. Lequita Halt, Carmon Ginsberg [facility]  In an effort to better capture your patient's severity of illness, reflect appropriate length of stay and utilization of resources, a review of the patient medical record has revealed the following indicators.    Based on your clinical judgment, please clarify and document in a progress note and/or discharge summary the clinical condition associated with the following supporting information:  In responding to this query please exercise your independent judgment.  The fact that a query is asked, does not imply that any particular answer is desired or expected.  Pt admitted for ORIF tibial plateau  Pre Op H/H=8.8/26.7  Post Op H/H=8.2/25.5  Clarification Needed   Please clarify whether or not the abnormal H/H can be further specified as one of the diagnoses listed below and document in the pn or d/c summary.   Possible Clinical Conditions?   " Expected Acute Blood Loss Anemia  " Acute Blood Loss Anemia  " Acute on chronic blood loss anemia   " Other Condition________________  " Cannot Clinically Determine  Risk Factors: (recent surgery, pre op anemia, EBL in OR)  Supporting Information:  Signs and Symptoms   ORIF right tibial plateau,  HTN, thyroid  Diagnostics: Component     Latest Ref Rng 04/26/2012 04/27/2012 04/28/2012  Hemoglobin     12.0 - 15.0 g/dL 8.8 (L) 8.3 (L) 8.2 (L)  HCT     36.0 - 46.0 % 26.7 (L) 25.1 (L) 25.5 (L)   Treatments: 0.9 % sodium chloride infusion     The patient presented with anemia thus we can not consider it as blood loss anemia since it was present prior to surgery Reviewed:  no additional documentation provided  Thank You,  Enis Slipper  RN, BSN, MSN/Inf, CCDS Clinical Documentation Specialist [facility] Dept Pager: [phone] / E-mail: [email or web address]  Health Information Management [place]

## 2012-04-29 NOTE — Progress Notes (Signed)
Physical Therapy Treatment Patient Details Name: Tina Wheeler MRN: [id number] DOB: 1932/01/26 Today's Date: 04/29/2012 Time: 2130-8657 PT Time Calculation (min): 32 min  PT Assessment / Plan / Recommendation Comments on Treatment Session  POD # 2 R ORIF 2nd Tib Plat fx TTWB with KI all times except for hygiene and gentle ROM.  Also R ankle fx non displaced "chip" with lateral support ankle splint.  Performed R LE TE's then assisted pt OOB to recliner.  Pt plans to D/C to SNF.    Follow Up Recommendations  SNF     Does the patient have the potential to tolerate intense rehabilitation     Barriers to Discharge        Equipment Recommendations  None recommended by PT    Recommendations for Other Services    Frequency Min 5X/week   Plan Discharge plan remains appropriate    Precautions / Restrictions Precautions Precautions: Fall Required Braces or Orthoses: Knee Immobilizer - Right Knee Immobilizer - Right: On at all times;Other (comment) (KI can be removed for hygiene and gentle ROM) Other Brace/Splint: ankle brace Restrictions Weight Bearing Restrictions: Yes RLE Weight Bearing: Touchdown weight bearing   Pertinent Vitals/Pain Pre medicated C/o "some" distal R leg pain with act    Mobility  Bed Mobility Bed Mobility: Supine to Sit;Sitting - Scoot to Edge of Bed Supine to Sit: 2: Max assist Sitting - Scoot to [place] of Bed: 1: +1 Total assist Details for Bed Mobility Assistance: HOB elevated and use of bed pad to swival hips around while supporting R LE Transfers Transfers: Sit to Stand;Stand to Sit Sit to Stand: 1: +1 Total assist Sit to Stand: Patient Percentage: 60% Stand to Sit: 1: +1 Total assist Stand to Sit: Patient Percentage: 60% Details for Transfer Assistance: 75% VC's on proper tech, hand placement plus VC's to decrease fear/anxiety.  Pt also required 50% VC's for TTWB and how to pivot on L LE to complete her turn to [facility]. Ambulation/Gait Ambulation/Gait  Assistance: Not tested (comment) (Transfers only today)    Exercises Total Joint Exercises Ankle Circles/Pumps: AROM;Both;10 reps;Supine Quad Sets: AROM;Both;10 reps;Supine Gluteal Sets: AROM;Both;10 reps;Supine Towel Squeeze: AROM;Both;10 reps;Supine Heel Slides: AAROM;Right;10 reps;Supine;Other (comment) (gentle) Hip ABduction/ADduction: AROM;Right;10 reps;Supine Straight Leg Raises: AROM;Right;10 reps;Supine    PT Goals                                    progressing    Visit Information  Last PT Received On: 04/29/12 Assistance Needed: +2    Subjective Data      Cognition    good   Balance   poor  End of Session PT - End of Session Equipment Utilized During Treatment: Gait belt;Right knee immobilizer Activity Tolerance: Patient tolerated treatment well Patient left: in chair;with call bell/phone within reach   Felecia Shelling  PTA [facility]  Acute  Rehab Pager      [phone]

## 2012-04-30 ENCOUNTER — Encounter (HOSPITAL_COMMUNITY): Payer: Self-pay | Admitting: Orthopedic Surgery

## 2012-04-30 LAB — BASIC METABOLIC PANEL
BUN: 17 mg/dL (ref 6–23)
GFR calc Af Amer: >90 mL/min (ref >90)
GFR calc non Af Amer: 78 mL/min — ABNORMAL LOW (ref >90)
Potassium: 3.8 mEq/L (ref 3.5–5.1)
Sodium: 123 mEq/L — ABNORMAL LOW (ref 135–145)

## 2012-04-30 MED ORDER — ALUM & MAG HYDROXIDE-SIMETH 200-200-20 MG/5ML PO SUSP: 30.0000 mL | Freq: Four times a day (QID) | ORAL | Status: DC | PRN

## 2012-04-30 MED ORDER — ONDANSETRON HCL 4 MG PO TABS: 4.0000 mg | ORAL_TABLET | Freq: Four times a day (QID) | ORAL | Status: DC | PRN

## 2012-04-30 MED ORDER — BISACODYL 10 MG RE SUPP: 10.0000 mg | Freq: Every day | RECTAL | Status: DC | PRN

## 2012-04-30 MED ORDER — METHOCARBAMOL 500 MG PO TABS: 500.0000 mg | ORAL_TABLET | Freq: Four times a day (QID) | ORAL | Status: DC | PRN

## 2012-04-30 MED ORDER — DSS 100 MG PO CAPS: 100.0000 mg | ORAL_CAPSULE | Freq: Two times a day (BID) | ORAL | Status: DC

## 2012-04-30 MED ORDER — ENOXAPARIN SODIUM 40 MG/0.4ML ~~LOC~~ SOLN: 40.0000 mg | SUBCUTANEOUS | Status: DC

## 2012-04-30 MED ORDER — TRAMADOL HCL 50 MG PO TABS: 50.0000 mg | ORAL_TABLET | Freq: Four times a day (QID) | ORAL | Status: DC | PRN

## 2012-04-30 MED ORDER — OXYCODONE HCL 5 MG PO TABS: 5.0000 mg | ORAL_TABLET | ORAL | Status: DC | PRN

## 2012-04-30 MED ORDER — POLYETHYLENE GLYCOL 3350 17 G PO PACK: 17.0000 g | PACK | Freq: Every day | ORAL | Status: DC | PRN

## 2012-04-30 MED ORDER — PREDNISOLONE ACETATE 0.12 % OP SUSP: 1.0000 [drp] | Freq: Two times a day (BID) | OPHTHALMIC | Status: AC

## 2012-04-30 NOTE — Discharge Summary (Signed)
Physician Discharge Summary   Patient ID: Tina Wheeler MRN: [id number] DOB/AGE: 1931/04/15 79 y.o.  Admit date: 04/26/2012 Discharge date: 04/30/2012  Primary Diagnosis:  Right leg tibial plateau fx and fibula fx)  Admission Diagnoses:  Past Medical History  Diagnosis Date  . Hypertension    Discharge Diagnoses:   Principal Problem:   Fracture of tibial plateau, closed  Estimated body mass index is 31.05 kg/(m^2) as calculated from the following:   Height as of this encounter: 5\' 4"  (1.626 m).   Weight as of this encounter: 82.101 kg (181 lb).  Classification of overweight in adults according to BMI (WHO, 1998)   Procedure:  Procedure(s) (LRB): OPEN REDUCTION INTERNAL FIXATION (ORIF) TIBIAL PLATEAU (Right)   Consults: None  HPI: Tina Wheeler is a 79 year old female who was walking  down her stairs, missed the bottom step two nights ago, slipped and  fell, sustaining immediate pain in the right leg. Radiographs  demonstrated tibial plateau fracture mainly at the metaphysis diaphyseal  junction. CT scan confirmed this. She presents now for open reduction  and internal fixation.  Laboratory Data: Admission on 04/25/2012  Component Date Value Range Status  . Sodium 04/26/2012 128* 135 - 145 mEq/L Final  . Potassium 04/26/2012 4.3  3.5 - 5.1 mEq/L Final  . Chloride 04/26/2012 96  96 - 112 mEq/L Final  . CO2 04/26/2012 26  19 - 32 mEq/L Final  . Glucose, Bld 04/26/2012 118* 70 - 99 mg/dL Final  . BUN 56/21/3086 16  6 - 23 mg/dL Final  . Creatinine, Ser 04/26/2012 0.98  0.50 - 1.10 mg/dL Final  . Calcium 57/84/6962 9.1  8.4 - 10.5 mg/dL Final  . GFR calc non Af Amer 04/26/2012 54* >90 mL/min Final  . GFR calc Af Amer 04/26/2012 62* >90 mL/min Final   Comment:                                 The eGFR has been calculated                          using the CKD EPI equation.                          This calculation has not been                          validated in all  clinical                          situations.                          eGFR's persistently                          <90 mL/min signify                          possible Chronic Kidney Disease.  . WBC 04/26/2012 4.9  4.0 - 10.5 K/uL Final  . RBC 04/26/2012 3.38* 3.87 - 5.11 MIL/uL Final  . Hemoglobin 04/26/2012 8.8* 12.0 - 15.0 g/dL Final  . HCT 95/28/4132 26.7* 36.0 - 46.0 % Final  . MCV 04/26/2012 79.0  78.0 -  100.0 fL Final  . MCH 04/26/2012 26.0  26.0 - 34.0 pg Final  . MCHC 04/26/2012 33.0  30.0 - 36.0 g/dL Final  . RDW 16/12/9602 15.5  11.5 - 15.5 % Final  . Platelets 04/26/2012 232  150 - 400 K/uL Final  . Sodium 04/27/2012 129* 135 - 145 mEq/L Final  . Potassium 04/27/2012 4.5  3.5 - 5.1 mEq/L Final  . Chloride 04/27/2012 96  96 - 112 mEq/L Final  . CO2 04/27/2012 26  19 - 32 mEq/L Final  . Glucose, Bld 04/27/2012 119* 70 - 99 mg/dL Final  . BUN 54/11/8117 16  6 - 23 mg/dL Final  . Creatinine, Ser 04/27/2012 0.87  0.50 - 1.10 mg/dL Final  . Calcium 14/78/2956 8.9  8.4 - 10.5 mg/dL Final  . GFR calc non Af Amer 04/27/2012 62* >90 mL/min Final  . GFR calc Af Amer 04/27/2012 72* >90 mL/min Final   Comment:                                 The eGFR has been calculated                          using the CKD EPI equation.                          This calculation has not been                          validated in all clinical                          situations.                          eGFR's persistently                          <90 mL/min signify                          possible Chronic Kidney Disease.  . WBC 04/27/2012 4.8  4.0 - 10.5 K/uL Final  . RBC 04/27/2012 3.15* 3.87 - 5.11 MIL/uL Final  . Hemoglobin 04/27/2012 8.3* 12.0 - 15.0 g/dL Final  . HCT 21/30/8657 25.1* 36.0 - 46.0 % Final  . MCV 04/27/2012 79.7  78.0 - 100.0 fL Final  . MCH 04/27/2012 26.3  26.0 - 34.0 pg Final  . MCHC 04/27/2012 33.1  30.0 - 36.0 g/dL Final  . RDW 84/69/6295 15.8* 11.5 - 15.5 % Final  .  Platelets 04/27/2012 201  150 - 400 K/uL Final  . ABO/RH(D) 04/27/2012 A POS   Final  . Antibody Screen 04/27/2012 NEG   Final  . Sample Expiration 04/27/2012 04/30/2012   Final  . MRSA, PCR 04/27/2012 NEGATIVE  NEGATIVE Final  . Staphylococcus aureus 04/27/2012 POSITIVE* NEGATIVE Final   Comment:                                 The Xpert SA Assay (FDA  approved for NASAL specimens                          in patients over 5 years of age),                          is one component of                          a comprehensive surveillance                          program.  Test performance has                          been validated by [employer] for patients greater                          than or equal to 60 year old.                          It is not intended                          to diagnose infection nor to                          guide or monitor treatment.  . Sodium 04/28/2012 128* 135 - 145 mEq/L Final  . Potassium 04/28/2012 4.4  3.5 - 5.1 mEq/L Final  . Chloride 04/28/2012 97  96 - 112 mEq/L Final  . CO2 04/28/2012 26  19 - 32 mEq/L Final  . Glucose, Bld 04/28/2012 119* 70 - 99 mg/dL Final  . BUN 16/12/9602 12  6 - 23 mg/dL Final  . Creatinine, Ser 04/28/2012 0.74  0.50 - 1.10 mg/dL Final  . Calcium 54/11/8117 8.6  8.4 - 10.5 mg/dL Final  . GFR calc non Af Amer 04/28/2012 79* >90 mL/min Final  . GFR calc Af Amer 04/28/2012 >90  >90 mL/min Final   Comment:                                 The eGFR has been calculated                          using the CKD EPI equation.                          This calculation has not been                          validated in all clinical                          situations.                          eGFR's persistently                          <  90 mL/min signify                          possible Chronic Kidney Disease.  . WBC 04/28/2012 7.0  4.0 - 10.5 K/uL Final  . RBC 04/28/2012  3.18* 3.87 - 5.11 MIL/uL Final  . Hemoglobin 04/28/2012 8.2* 12.0 - 15.0 g/dL Final  . HCT 16/12/9602 25.5* 36.0 - 46.0 % Final  . MCV 04/28/2012 80.2  78.0 - 100.0 fL Final  . MCH 04/28/2012 25.8* 26.0 - 34.0 pg Final  . MCHC 04/28/2012 32.2  30.0 - 36.0 g/dL Final  . RDW 54/11/8117 15.8* 11.5 - 15.5 % Final  . Platelets 04/28/2012 198  150 - 400 K/uL Final  . Sodium 04/29/2012 126* 135 - 145 mEq/L Final  . Potassium 04/29/2012 4.1  3.5 - 5.1 mEq/L Final  . Chloride 04/29/2012 94* 96 - 112 mEq/L Final  . CO2 04/29/2012 26  19 - 32 mEq/L Final  . Glucose, Bld 04/29/2012 114* 70 - 99 mg/dL Final  . BUN 14/78/2956 14  6 - 23 mg/dL Final  . Creatinine, Ser 04/29/2012 0.76  0.50 - 1.10 mg/dL Final  . Calcium 21/30/8657 9.0  8.4 - 10.5 mg/dL Final  . GFR calc non Af Amer 04/29/2012 79* >90 mL/min Final  . GFR calc Af Amer 04/29/2012 >90  >90 mL/min Final   Comment:                                 The eGFR has been calculated                          using the CKD EPI equation.                          This calculation has not been                          validated in all clinical                          situations.                          eGFR's persistently                          <90 mL/min signify                          possible Chronic Kidney Disease.     X-Rays:Dg Tibia/fibula Right  04/26/2012  *RADIOLOGY REPORT*  Clinical Data: 79 year old female right tibial plateau fracture.  RIGHT TIBIA AND FIBULA - 2 VIEW  Comparison: 04/25/2012.  Findings: Splint material about the right lower extremity limiting bone detail.  Depressed and comminuted right tibial plateau fracture appears stable.  Comminuted fracture of the right fibular head likewise appears not significantly changed.  Grossly normal alignment at the right ankle.  IMPRESSION: Splint material about the right lower extremity.  Stable appearance of comminuted and depressed tibial plateau fracture and comminuted fibular head  fracture.   Original Report Authenticated By: Erskine Speed, M.D.    Ct Tibia Fibula Right Wo Contrast  04/26/2012  *RADIOLOGY  REPORT*  Clinical Data:  Fall with a right tibial fracture.  CT RIGHT LOWER LEG WITHOUT CONTRAST  Technique:  Multidetector CT imaging of the right lower leg was performed according to the standard protocol without intravenous contrast. Multiplanar CT image reconstructions were also generated.  Comparison:  Plain films 04/25/2012.  Findings:  The patient has a transverse fracture through the tibial metaphysis with mild impaction of up to 0.8 cm.  The fracture extends through the posterior aspect of the lateral tibial plateau. Fracture fragment off the lateral tibial plateau is posterior and measures 2.3 x 1.1 cm at the articular surface.  There is no displacement of this fragment. Lipohemarthrosis in association the patient's fractures is noted.  The patient has a fibular neck fracture with impaction of 2.4 cm. The fracture involves the fibular head and there is some comminution.  As seen on plain films, there is a nondisplaced fracture of the lateral malleolus.  Also identified is a nondisplaced fracture of the anterolateral corner of the tibia.  No other fracture is identified.  IMPRESSION:  1.  Mildly impacted fracture of the proximal tibial metaphysis extends into the posterior aspect of the lateral tibial plateau without displacement. 2.  Impacted and mildly comminuted fibular head and neck fracture. 3.  Nondisplaced fracture lateral malleolus.  Nondisplaced chip fracture off the anterolateral corner of the tibial plafond is also identified. 4.  Osteopenia.   Original Report Authenticated By: Holley Dexter, M.D.    Dg C-arm 1-60 Min-no Report  04/27/2012  CLINICAL DATA: rt knee   C-ARM 1-60 MINUTES  Fluoroscopy was utilized by the requesting physician.  No radiographic  interpretation.     Dg Knee 2 Views Right  04/27/2012  *RADIOLOGY REPORT*  Clinical Data: ORIF tibial plateau  fracture  RIGHT KNEE - 3 VIEW  Comparison: Plain films 04/26/2002  Findings: Internal plate fixation of tibial plateau fracture with 9 cortical screws. Fibular fracture also noted.  IMPRESSION: ORIF right tibial plateau fracture.   Original Report Authenticated By: Genevive Bi, M.D.     EKG: Orders placed during the hospital encounter of 04/25/12  . EKG 12-LEAD  . EKG 12-LEAD     Hospital Course: Tina Wheeler is a 79 y.o. who was admitted to [facility] on 2/7/2014and placed at bedrest for the above stated diagnosis. She was initially seen at [facility] where she was diagnosed with a tibia and fibula fracture of the right leg. She was then transferred to [facility] and accepted in transfer by Dr. Charlann Boxer.  Dr. Lequita Halt was notified and took over care the following day. They were brought to the operating room on 04/27/2012 and underwent Procedure(s): OPEN REDUCTION INTERNAL FIXATION (ORIF) TIBIAL PLATEAU.  Patient tolerated the procedure well and was later transferred to the recovery room and then to the orthopaedic floor for postoperative care.  They were given PO and IV analgesics for pain control following their surgery.  They were given 24 hours of postoperative antibiotics of  Anti-infectives   Start     Dose/Rate Route Frequency Ordered Stop   04/27/12 1800  ceFAZolin (ANCEF) IVPB 1 g/50 mL premix     1 g 100 mL/hr over 30 Minutes Intravenous Every 6 hours 04/27/12 1513 04/28/12 0658   04/27/12 1030  ceFAZolin (ANCEF) IVPB 1 g/50 mL premix  Status:  Discontinued     1 g 100 mL/hr over 30 Minutes Intravenous  Once 04/27/12 0816 04/27/12 0823   04/27/12 1030  [MAR Hold]  ceFAZolin (ANCEF) IVPB 2 g/50 mL premix     (On MAR Hold since 04/27/12 0950)   2 g 100 mL/hr over 30 Minutes Intravenous  Once 04/27/12 0824 04/27/12 1112     and started on DVT prophylaxis in the form of Lovenox.   PT and OT were ordered for mobility and therapy.  Discharge planning consulted to help  with postop disposition and equipment needs.  Patient had a decent night on the evening of surgery and started to get up OOB with therapy on day one requiring a lot of assitance.  It was felt that she would benefit form a SNF stay. Social worker consulted to assist with placement.  Continued to work with therapy into day two.  Dressing was changed on day two and the incision was healing well.  By day three, social worker felt that a bed would be available.  Seen in rounds by Dr. Lequita Halt and it was decided that the patient would be transferred over to the SNF of choice when bad available.   Discharge Medications: Prior to Admission medications   Medication Sig Start Date End Date Taking? Authorizing Provider  enalapril-hydrochlorothiazide (VASERETIC) 10-25 MG per tablet Take 1 tablet by mouth daily.   Yes Historical Provider, MD  levothyroxine (SYNTHROID, LEVOTHROID) 125 MCG tablet Take 125 mcg by mouth daily.   Yes Historical Provider, MD  alum & mag hydroxide-simeth (MAALOX/MYLANTA) 200-200-20 MG/5ML suspension Take 30 mLs by mouth every 6 (six) hours as needed. 04/30/12   Alexzandrew Julien Girt, PA  bisacodyl (DULCOLAX) 10 MG suppository Place 1 suppository (10 mg total) rectally daily as needed. 04/30/12   Alexzandrew Perkins, PA  docusate sodium 100 MG CAPS Take 100 mg by mouth 2 (two) times daily. 04/30/12   Alexzandrew Perkins, PA  enoxaparin (LOVENOX) 40 MG/0.4ML injection Inject 0.4 mLs (40 mg total) into the skin daily. Continue Lovenox for eight more days, then start a 325 mg Aspirin daily for four weeks. 04/30/12   Alexzandrew Julien Girt, PA  methocarbamol (ROBAXIN) 500 MG tablet Take 1 tablet (500 mg total) by mouth every 6 (six) hours as needed. 04/30/12   Alexzandrew Perkins, PA  ondansetron (ZOFRAN) 4 MG tablet Take 1 tablet (4 mg total) by mouth every 6 (six) hours as needed for nausea. 04/30/12   Alexzandrew Julien Girt, PA  oxyCODONE (OXY IR/ROXICODONE) 5 MG immediate release tablet Take 1-2 tablets  (5-10 mg total) by mouth every 3 (three) hours as needed. 04/30/12   Alexzandrew Julien Girt, PA  polyethylene glycol (MIRALAX / GLYCOLAX) packet Take 17 g by mouth daily as needed. 04/30/12   Alexzandrew Julien Girt, PA  prednisoLONE acetate (PRED MILD) 0.12 % ophthalmic suspension Place 1 drop into the right eye 2 (two) times daily. 04/30/12   Alexzandrew Julien Girt, PA  traMADol (ULTRAM) 50 MG tablet Take 1-2 tablets (50-100 mg total) by mouth every 6 (six) hours as needed (mild pain). 04/30/12   Alexzandrew Julien Girt, PA    Diet: Cardiac diet Activity: Touch Down Weight Bearing ONLY to Right Leg - Do Not Advance Follow-up:in 2 weeks Disposition - Skilled nursing facility Discharged Condition: stable   Discharge Orders   Future Orders Complete By Expires     Call MD / Call 911  As directed     Comments:      If you experience chest pain or shortness of breath, CALL 911 and be transported to the hospital emergency room.  If you develope a fever above 101 F, pus (white drainage) or increased drainage or redness  at the wound, or calf pain, call your surgeon's office.    Change dressing  As directed     Comments:      Change dressing daily with sterile 4 x 4 inch gauze dressing and apply TED hose. Do not submerge the incision under water.    Constipation Prevention  As directed     Comments:      Drink plenty of fluids.  Prune juice may be helpful.  You may use a stool softener, such as Colace (over the counter) 100 mg twice a day.  Use MiraLax (over the counter) for constipation as needed.    Diet - low sodium heart healthy  As directed     Discharge instructions  As directed     Comments:      Pick up stool softner and laxative for home. Do not submerge incision under water. May shower. Continue to use ice for pain and swelling from surgery.  Continue Lovenox for eight more days, then start a 325 mg Aspirin daily for four weeks.  Touch Down Weight Bearing ONLY - Do Not Advance    Do not sit on low  chairs, stoools or toilet seats, as it may be difficult to get up from low surfaces  As directed     Driving restrictions  As directed     Comments:      No driving until released by the physician.    Increase activity slowly as tolerated  As directed     Lifting restrictions  As directed     Comments:      No lifting until released by the physician.    Patient may shower  As directed     Comments:      You may shower without a dressing once there is no drainage.  Do not wash over the wound.  If drainage remains, do not shower until drainage stops.    TED hose  As directed     Comments:      Use stockings (TED hose) for 3 weeks on both leg(s).  You may remove them at night for sleeping.    Touch down weight bearing  As directed     Scheduling Instructions:      Do Not Advance Weight Bearing Status        Medication List    STOP taking these medications       HYDROcodone-acetaminophen 5-325 MG per tablet  Commonly known as:  NORCO/VICODIN     ibuprofen 600 MG tablet  Commonly known as:  ADVIL,MOTRIN      TAKE these medications       alum & mag hydroxide-simeth 200-200-20 MG/5ML suspension  Commonly known as:  MAALOX/MYLANTA  Take 30 mLs by mouth every 6 (six) hours as needed.     bisacodyl 10 MG suppository  Commonly known as:  DULCOLAX  Place 1 suppository (10 mg total) rectally daily as needed.     DSS 100 MG Caps  Take 100 mg by mouth 2 (two) times daily.     enalapril-hydrochlorothiazide 10-25 MG per tablet  Commonly known as:  VASERETIC  Take 1 tablet by mouth daily.     enoxaparin 40 MG/0.4ML injection  Commonly known as:  LOVENOX  Inject 0.4 mLs (40 mg total) into the skin daily. Continue Lovenox for eight more days, then start a 325 mg Aspirin daily for four weeks.     levothyroxine 125 MCG tablet  Commonly known as:  SYNTHROID, LEVOTHROID  Take 125 mcg by mouth daily.     methocarbamol 500 MG tablet  Commonly known as:  ROBAXIN  Take 1 tablet (500 mg  total) by mouth every 6 (six) hours as needed.     ondansetron 4 MG tablet  Commonly known as:  ZOFRAN  Take 1 tablet (4 mg total) by mouth every 6 (six) hours as needed for nausea.     oxyCODONE 5 MG immediate release tablet  Commonly known as:  Oxy IR/ROXICODONE  Take 1-2 tablets (5-10 mg total) by mouth every 3 (three) hours as needed.     polyethylene glycol packet  Commonly known as:  MIRALAX / GLYCOLAX  Take 17 g by mouth daily as needed.     prednisoLONE acetate 0.12 % ophthalmic suspension  Commonly known as:  PRED MILD  Place 1 drop into the right eye 2 (two) times daily.     traMADol 50 MG tablet  Commonly known as:  ULTRAM  Take 1-2 tablets (50-100 mg total) by mouth every 6 (six) hours as needed (mild pain).           Follow-up Information   Follow up with Loanne Drilling, MD. Schedule an appointment as soon as possible for a visit in 2 weeks.   Contact information:   [address] [place] [address] [phone]       Signed: Patrica Duel 04/30/2012, 8:50 AM

## 2012-04-30 NOTE — Progress Notes (Signed)
   Subjective: 3 Days Post-Op Procedure(s) (LRB): OPEN REDUCTION INTERNAL FIXATION (ORIF) TIBIAL PLATEAU (Right) Patient reports pain as mild and moderate.   Continue therapy today.  Plan is to go Skilled nursing facility after hospital stay.  Awaiting final bed notifications.  Objective: Vital signs in last 24 hours: Temp:  [98.2 F (36.8 C)-99.4 F (37.4 C)] 99.4 F (37.4 C) (02/11 0500) Pulse Rate:  [92-104] 97 (02/11 0500) Resp:  [16-18] 16 (02/11 0500) BP: (125-163)/(68-73) 125/68 mmHg (02/11 0500) SpO2:  [92 %-97 %] 94 % (02/11 0500)  Intake/Output from previous day: 02/10 0701 - 02/11 0700 In: 583.3 [P.O.:480; I.V.:103.3] Out: 2150 [Urine:2150] Intake/Output this shift:     Recent Labs  04/28/12 0410  HGB 8.2*    Recent Labs  04/28/12 0410  WBC 7.0  RBC 3.18*  HCT 25.5*  PLT 198    Recent Labs  04/28/12 0410 04/29/12 0420  NA 128* 126*  K 4.4 4.1  CL 97 94*  CO2 26 26  BUN 12 14  CREATININE 0.74 0.76  GLUCOSE 119* 114*  CALCIUM 8.6 9.0   No results found for this basename: LABPT, INR,  in the last 72 hours  EXAM  General - Patient is Alert, Appropriate and Oriented  Extremity - Neurologically intact  Neurovascular intact  Compartment soft  Incision - looks excellent, no sings of infection, no drainage. Motor Function - intact, moving foot and toes well on exam.     Assessment/Plan: 3 Days Post-Op Procedure(s) (LRB): OPEN REDUCTION INTERNAL FIXATION (ORIF) TIBIAL PLATEAU (Right)  Social work consult for SNF Discharge to SNF DVT Prophylaxis - Lovenox  TDWB RLE  Patrica Duel 04/30/2012, 8:33 AM

## 2012-04-30 NOTE — Progress Notes (Signed)
Clinical Social Work Department CLINICAL SOCIAL WORK PLACEMENT NOTE 04/30/2012  Patient:  Tina Wheeler, Tina Wheeler  Account Number:  [id number] Admit date:  04/25/2012  Clinical Social Worker:  Leron Croak, CLINICAL SOCIAL WORKER  Date/time:  04/28/2012 02:01 PM  Clinical Social Work is seeking post-discharge placement for this patient at the following level of care:   SKILLED NURSING   (*CSW will update this form in Epic as items are completed)   04/28/2012  Patient/family provided with [facility] Department of Clinical Social Work's list of facilities offering this level of care within the geographic area requested by the patient (or if unable, by the patient's family).  04/28/2012  Patient/family informed of their freedom to choose among providers that offer the needed level of care, that participate in Medicare, Medicaid or managed care program needed by the patient, have an available bed and are willing to accept the patient.  04/28/2012  Patient/family informed of [facility]' ownership interest in [facility], as well as of the fact that they are under no obligation to receive care at this facility.  PASARR submitted to [employer] on 07/25/2007 PASARR number received from [employer] on 07/25/2007  FL2 transmitted to all facilities in geographic area requested by pt/family on  04/28/2012 FL2 transmitted to all facilities within larger geographic area on 04/28/2012  Patient informed that his/her managed care company has contracts with or will negotiate with  certain facilities, including the following:     Patient/family informed of bed offers received:  04/30/2012 Patient chooses bed at [facility] Physician recommends and patient chooses bed at    Patient to be transferred to [facility] on  04/30/2012 Patient to be transferred to facility by [facility]  The following physician request were entered in Epic:   Additional Comments:  Cori Razor LCSW [phone]

## 2012-05-02 DIAGNOSIS — D649 Anemia, unspecified: Secondary | ICD-10-CM | POA: Diagnosis present

## 2013-07-30 ENCOUNTER — Ambulatory Visit
Admission: RE | Admit: 2013-07-30 | Discharge: 2013-07-30 | Disposition: A | Discharge status: Home / Self Care | Payer: PRIVATE HEALTH INSURANCE | Source: Ambulatory Visit | Attending: Physician Assistant | Admitting: Physician Assistant

## 2013-07-30 ENCOUNTER — Other Ambulatory Visit: Payer: Self-pay | Admitting: Physician Assistant

## 2013-07-30 DIAGNOSIS — M8430XA Stress fracture, unspecified site, initial encounter for fracture: Secondary | ICD-10-CM

## 2014-02-02 DIAGNOSIS — S42291A Other displaced fracture of upper end of right humerus, initial encounter for closed fracture: Secondary | ICD-10-CM | POA: Insufficient documentation

## 2017-02-02 ENCOUNTER — Inpatient Hospital Stay: Payer: Medicaid - Out of State

## 2017-02-02 ENCOUNTER — Inpatient Hospital Stay: Payer: Self-pay

## 2017-02-02 ENCOUNTER — Inpatient Hospital Stay (HOSPITAL_COMMUNITY)
Admission: AD | Admit: 2017-02-02 | Discharge: 2017-02-06 | DRG: 470 | Disposition: A | Discharge status: Skilled Nursing Facility | Payer: Medicare HMO | Source: Other Acute Inpatient Hospital | Attending: Nephrology | Admitting: Nephrology

## 2017-02-02 ENCOUNTER — Inpatient Hospital Stay (HOSPITAL_COMMUNITY): Payer: Medicare HMO

## 2017-02-02 ENCOUNTER — Encounter (HOSPITAL_COMMUNITY): Payer: Self-pay

## 2017-02-02 ENCOUNTER — Other Ambulatory Visit: Payer: Self-pay

## 2017-02-02 DIAGNOSIS — Z8249 Family history of ischemic heart disease and other diseases of the circulatory system: Secondary | ICD-10-CM

## 2017-02-02 DIAGNOSIS — Y92013 Bedroom of single-family (private) house as the place of occurrence of the external cause: Secondary | ICD-10-CM

## 2017-02-02 DIAGNOSIS — E871 Hypo-osmolality and hyponatremia: Secondary | ICD-10-CM | POA: Diagnosis present

## 2017-02-02 DIAGNOSIS — S72002G Fracture of unspecified part of neck of left femur, subsequent encounter for closed fracture with delayed healing: Secondary | ICD-10-CM | POA: Diagnosis not present

## 2017-02-02 DIAGNOSIS — Z7982 Long term (current) use of aspirin: Secondary | ICD-10-CM | POA: Diagnosis not present

## 2017-02-02 DIAGNOSIS — I10 Essential (primary) hypertension: Secondary | ICD-10-CM | POA: Diagnosis present

## 2017-02-02 DIAGNOSIS — K5900 Constipation, unspecified: Secondary | ICD-10-CM | POA: Diagnosis present

## 2017-02-02 DIAGNOSIS — E039 Hypothyroidism, unspecified: Secondary | ICD-10-CM | POA: Diagnosis present

## 2017-02-02 DIAGNOSIS — Z96649 Presence of unspecified artificial hip joint: Secondary | ICD-10-CM

## 2017-02-02 DIAGNOSIS — W010XXA Fall on same level from slipping, tripping and stumbling without subsequent striking against object, initial encounter: Secondary | ICD-10-CM | POA: Diagnosis present

## 2017-02-02 DIAGNOSIS — S72002A Fracture of unspecified part of neck of left femur, initial encounter for closed fracture: Secondary | ICD-10-CM | POA: Diagnosis present

## 2017-02-02 DIAGNOSIS — Z79899 Other long term (current) drug therapy: Secondary | ICD-10-CM

## 2017-02-02 DIAGNOSIS — F0390 Unspecified dementia without behavioral disturbance: Secondary | ICD-10-CM | POA: Diagnosis present

## 2017-02-02 DIAGNOSIS — Z87891 Personal history of nicotine dependence: Secondary | ICD-10-CM | POA: Diagnosis not present

## 2017-02-02 DIAGNOSIS — D649 Anemia, unspecified: Secondary | ICD-10-CM | POA: Diagnosis not present

## 2017-02-02 DIAGNOSIS — D638 Anemia in other chronic diseases classified elsewhere: Secondary | ICD-10-CM | POA: Diagnosis present

## 2017-02-02 DIAGNOSIS — M25552 Pain in left hip: Secondary | ICD-10-CM | POA: Diagnosis present

## 2017-02-02 HISTORY — DX: Hypothyroidism, unspecified: E03.9

## 2017-02-02 LAB — TYPE AND SCREEN
ABO/RH(D): A POS
ANTIBODY SCREEN: NEGATIVE

## 2017-02-02 LAB — SURGICAL PCR SCREEN
MRSA, PCR: POSITIVE — AB
Staphylococcus aureus: POSITIVE — AB

## 2017-02-02 MED ORDER — MORPHINE SULFATE (PF) 4 MG/ML IV SOLN
0.5000 mg | INTRAVENOUS | Status: DC | PRN
  Administered 2017-02-02 – 2017-02-04 (×7): 0.52 mg via INTRAVENOUS
  Filled 2017-02-02 (×7): qty 1

## 2017-02-02 MED ORDER — SODIUM CHLORIDE 0.9 % IV SOLN
INTRAVENOUS | Status: DC
  Administered 2017-02-02 – 2017-02-05 (×3): via INTRAVENOUS

## 2017-02-02 MED ORDER — POLYETHYLENE GLYCOL 3350 17 G PO PACK
17.0000 g | PACK | Freq: Every day | ORAL | Status: DC
  Administered 2017-02-02 – 2017-02-05 (×3): 17 g via ORAL
  Filled 2017-02-02 (×3): qty 1

## 2017-02-02 MED ORDER — METHOCARBAMOL 1000 MG/10ML IJ SOLN
500.0000 mg | Freq: Four times a day (QID) | INTRAVENOUS | Status: DC | PRN
  Administered 2017-02-02 – 2017-02-04 (×2): 500 mg via INTRAVENOUS
  Filled 2017-02-02 (×2): qty 550
  Filled 2017-02-02: qty 5

## 2017-02-02 MED ORDER — METHOCARBAMOL 500 MG PO TABS
500.0000 mg | ORAL_TABLET | Freq: Four times a day (QID) | ORAL | Status: DC | PRN
  Administered 2017-02-03 – 2017-02-06 (×3): 500 mg via ORAL
  Filled 2017-02-02 (×4): qty 1

## 2017-02-02 MED ORDER — DOCUSATE SODIUM 100 MG PO CAPS
100.0000 mg | ORAL_CAPSULE | Freq: Two times a day (BID) | ORAL | Status: DC
  Administered 2017-02-02 – 2017-02-06 (×7): 100 mg via ORAL
  Filled 2017-02-02 (×7): qty 1

## 2017-02-02 MED ORDER — HYDROCODONE-ACETAMINOPHEN 5-325 MG PO TABS
1.0000 | ORAL_TABLET | Freq: Four times a day (QID) | ORAL | Status: DC | PRN
  Administered 2017-02-02: 19:00:00 1 via ORAL
  Administered 2017-02-03: 2 via ORAL
  Administered 2017-02-03 (×2): 1 via ORAL
  Administered 2017-02-04 – 2017-02-06 (×5): 2 via ORAL
  Filled 2017-02-02: qty 2
  Filled 2017-02-02: qty 1
  Filled 2017-02-02 (×2): qty 2
  Filled 2017-02-02 (×2): qty 1
  Filled 2017-02-02 (×4): qty 2
  Filled 2017-02-02: qty 1

## 2017-02-02 NOTE — Consult Note (Signed)
Reason for Consult: Left hip pain Referring Physician: Osvaldo ShipperGokul Krishnan, MD  Tina Wheeler is an 83 y.o. female.  HPI:   Patient is an 83 year old female who was transferred [facility] [place] continued care for a left hip injury.  She currently resides at home with her family and was getting ready for bed when she got tripped up and fell onto her left hip.  Her family heard the fall and ran in to assist the patient. She was able to stand and walk around the house prior to going to bed.  By the next morning, and attempting to get up to the bathroom, the pain started to increase and she was transported [facility] via [facility] where xrays showed a left hip fracture.  She was recommended to undergo surgery but family requested that she be [facility].  She has been a patient of Dr. Deri FuellingAluisio's in the past and family requested that she be treated by [facility]. Dr. Duwayne HeckJason Rogers was on call for Dr. Lequita HaltAluisio.  She was admitted by Medical service and ortho was consulted.  Seh was admitted and placed at bedrest.  She was unable to go to surgery at a reasonable time so she was allowed to eat and was setup to undergo surgery on Sunday.  Dr. Charlann Boxerlin, Dr Aluisio's and Dr. Aundria Rudogers' partner has been notified and will proceed with surgery on Sunday.  Past Medical History:  Diagnosis Date  . Hypertension   Hypothyroidism Dementia   Past Surgical History:  Procedure Laterality Date  . OPEN REDUCTION INTERNAL FIXATION (ORIF) TIBIAL PLATEAU Right 04/27/2012   Performed by Loanne DrillingAluisio, Frank V, MD at [facility]    History reviewed. No pertinent family history.  Social History:  reports that she quit smoking about 5 years ago. Her smoking use included cigarettes. she has never used smokeless tobacco. She reports that she does not drink alcohol or use drugs. Lives in [place] with her family.  Allergies: No Known Allergies  Medications: I have reviewed the  patient's current medications.  Colace Enalapril Omperazole Aspirin Iburopfen Prednisolone Levothyroxine  Results for orders placed or performed during the hospital encounter of 02/02/17 (from the past 48 hour(s))  Type and screen [facility]     Status: None   Collection Time: 02/02/17  2:02 PM  Result Value Ref Range   ABO/RH(D) A POS    Antibody Screen NEG    Sample Expiration 02/05/2017   Surgical PCR screen     Status: Abnormal   Collection Time: 02/02/17  2:38 PM  Result Value Ref Range   MRSA, PCR POSITIVE (A) NEGATIVE    Comment: RESULT CALLED TO, READ BACK BY AND VERIFIED WITH: TAYLOR,J. RN @1727  ON 11.16.18 BY COHEN,K    Staphylococcus aureus POSITIVE (A) NEGATIVE    Comment: (NOTE) The Xpert SA Assay (FDA approved for NASAL specimens in patients 522 years of age and older), is one component of a comprehensive surveillance program. It is not intended to diagnose infection nor to guide or monitor treatment.     Dg Outside Films Chest  Result Date: 02/02/2017 This examination belongs to an outside facility and is stored here for comparison purposes only.  Contact the originating outside institution for any associated report or interpretation.  Dg Outside Films Extremity  Result Date: 02/02/2017 This examination belongs to an outside facility and is stored here for comparison purposes only.  Contact the originating outside  institution for any associated report or interpretation.   ROS Blood pressure (!) 163/74, pulse 94, temperature 97.7 F (36.5 C), temperature source Axillary, resp. rate 16, height 5\' 4"  (1.626 m), weight 87 kg (191 lb 12.8 oz), SpO2 95 %. Physical Exam  Patient is an 83 year old female, NAD, Alert at time of eval.  Family in room at bedisde  Left lower extremity - no obvious shortening of the left leg Motor - moving foot and toes well on exam. Pain noted on left hip roll. Non tender to left foot, left leg, left  knee. Sensation intact to the left lower extremity.  Radiographs - impacted left femoral neck fracture.  LABS taken at [facility] 02/02/2017 Sodium 134 K+  4.3 BUN 12 Creat  0.63  WBC 5.4 HGB 9.7 HCT 313.8   Assessment/Plan: Left Femoral neck Fracture  HOLD ASA and IBUPROFEN Bedrest Foley to gravity PRN medications Consent  Diet as tolerated NPO Saturday night for tentative surgery on Sunday morning as per Dr. Charlann Boxerlin Type and Screen Recheck labs on Sat  Nahmir Zeidman, PA-C 02/02/2017, 5:57 PM

## 2017-02-02 NOTE — H&P (Addendum)
[facility] History and Physical  Tina LimeRosa L Wheeler NFA:[id number] DOB: 08-14-1931 DOA: 02/02/2017   PCP: Dr. Arville CareParks in [place] Specialists: Unknown  Chief Complaint: Pain in the left hip  HPI: Tina Wheeler is a 83 y.o. female with a past medical history of dementia, hypothyroidism, hypertension who was in her usual state of health until yesterday when she had a mechanical fall and could not get up.  She had pain in her left hip area.  She was taken to her local hospital where she was diagnosed with left hip fracture.  This needs surgical intervention.  Family and patient wanted to be operated upon by Dr. Despina HickAlusio who previously operated on her.  So she was transferred here to this facility.  Currently pain is 3 out of 10 in intensity.  Patient denies any chest pain, shortness of breath, nausea vomiting.  Has not had any urinary symptoms such as dysuria.  No diarrhea.  Denies any history of heart disease.  Home Medications: This medication list is not current. Prior to Admission medications   Medication Sig Start Date End Date Taking? Authorizing Provider  alum & mag hydroxide-simeth (MAALOX/MYLANTA) 200-200-20 MG/5ML suspension Take 30 mLs by mouth every 6 (six) hours as needed. 04/30/12   Perkins, Alexzandrew L, PA-C  bisacodyl (DULCOLAX) 10 MG suppository Place 1 suppository (10 mg total) rectally daily as needed. 04/30/12   Perkins, Alexzandrew L, PA-C  docusate sodium 100 MG CAPS Take 100 mg by mouth 2 (two) times daily. 04/30/12   Perkins, Alexzandrew L, PA-C  enalapril-hydrochlorothiazide (VASERETIC) 10-25 MG per tablet Take 1 tablet by mouth daily.    [provider]  enoxaparin (LOVENOX) 40 MG/0.4ML injection Inject 0.4 mLs (40 mg total) into the skin daily. Continue Lovenox for eight more days, then start a 325 mg Aspirin daily for four weeks. 04/30/12   Perkins, Alexzandrew L, PA-C  levothyroxine (SYNTHROID, LEVOTHROID) 125 MCG tablet Take 125 mcg by mouth daily.     [provider]  methocarbamol (ROBAXIN) 500 MG tablet Take 1 tablet (500 mg total) by mouth every 6 (six) hours as needed. 04/30/12   Perkins, Alexzandrew L, PA-C  ondansetron (ZOFRAN) 4 MG tablet Take 1 tablet (4 mg total) by mouth every 6 (six) hours as needed for nausea. 04/30/12   Perkins, Alexzandrew L, PA-C  oxyCODONE (OXY IR/ROXICODONE) 5 MG immediate release tablet Take 1-2 tablets (5-10 mg total) by mouth every 3 (three) hours as needed. 04/30/12   Perkins, Alexzandrew L, PA-C  polyethylene glycol (MIRALAX / GLYCOLAX) packet Take 17 g by mouth daily as needed. 04/30/12   Perkins, Alexzandrew L, PA-C  prednisoLONE acetate (PRED MILD) 0.12 % ophthalmic suspension Place 1 drop into the right eye 2 (two) times daily. 04/30/12   Perkins, Alexzandrew L, PA-C  traMADol (ULTRAM) 50 MG tablet Take 1-2 tablets (50-100 mg total) by mouth every 6 (six) hours as needed (mild pain). 04/30/12   Perkins, Alexzandrew L, PA-C    Allergies: No Known Allergies  Past Medical History: Past Medical History:  Diagnosis Date  . Hypertension     Past Surgical History:  Procedure Laterality Date  . OPEN REDUCTION INTERNAL FIXATION (ORIF) TIBIAL PLATEAU Right 04/27/2012   Performed by Loanne DrillingAluisio, Frank V, MD at [facility]    Social History:   Lives with her daughter and son-in-law in [place].  Denies smoking alcohol or illicit drug use.  Uses a walker to ambulate.  Family History:   Denies any health problems in  the family  Review of Systems - History obtained from the patient General ROS: negative Psychological ROS: negative Ophthalmic ROS: negative ENT ROS: negative Allergy and Immunology ROS: negative Hematological and Lymphatic ROS: negative Endocrine ROS: negative Respiratory ROS: negative Cardiovascular ROS: negative Gastrointestinal ROS: negative Genito-Urinary ROS: negative Musculoskeletal ROS: as in hpi Neurological ROS: no TIA or stroke symptoms Dermatological ROS:  negative  Physical Examination  Vital signs have not been charted yet.  I was told by the RN none of the vital signs had any alarm values.     General appearance: alert, cooperative, appears stated age, distracted and no distress Head: Normocephalic, without obvious abnormality, atraumatic Eyes: conjunctivae/corneas clear. PERRL, EOM's intact.  Throat: lips, mucosa, and tongue normal; teeth and gums normal Neck: no adenopathy, no carotid bruit, no JVD, supple, symmetrical, trachea midline and thyroid not enlarged, symmetric, no tenderness/mass/nodules Resp: clear to auscultation bilaterally Cardio: regular rate and rhythm, S1, S2 normal, no murmur, click, rub or gallop GI: soft, non-tender; bowel sounds normal; no masses,  no organomegaly Extremities: Left lower extremity is externally rotated. Pulses: 2+ and symmetric Skin: Skin color, texture, turgor normal. No rashes or lesions Lymph nodes: Cervical, supraclavicular, and axillary nodes normal. Neurologic: No focal deficits.   Labs on Admission: I have personally reviewed following labs and imaging studies  Labs done at the outside facility reviewed.  WBC 5.4.  Hemoglobin 9.7.  Platelet count 245.  Sodium was initially 130 subsequently 134.  Normal electrolytes otherwise.  Normal renal function.  Glucose 110.  Calcium 8.9.  Troponin 0 0.01.  UA did show leukocyte esterase with 5-10 WBCs however urine culture showed mixed flora.  Chest x-ray without any acute abnormalities.   My interpretation of Electrocardiogram: Sinus rhythm 96 bpm.  Normal axis.  First-degree AV block.  No concerning ST or T wave changes.   Problem List  Principal Problem:   Closed left hip fracture [facility]) Active Problems:   Essential hypertension   Hypothyroidism   Dementia   Assessment: This is a 83 year old Caucasian female with past medical history as stated earlier who appears to have had a mechanical fall resulting in left hip fracture.  This will  need surgical intervention.  Plan: #1 left hip fracture/preoperative evaluation: Pain control.  Hip fracture order set has been initiated.  Orthopedics to see the patient.  Unclear when she will undergo surgery.  Preoperative evaluation was done. Based on the [place] Perioperative Risk Index the patient's estimated risk probability for perioperative myocardial infarction or cardiac arrest is 0.43%. Patient's procedure is immediate risk. Patient's functional capacity is low to moderate. Based on the AHA/ACC algorithms patient may proceed to surgery without further testing. Pulmonary toilet. Incentive spirometry post operatively.  #2 history of hypothyroidism: Continue with the levothyroxine once home medication list has been reconciled.  #3  History of essential hypertension: Monitor blood pressures closely.  Treat as necessary.  #4  Anemia, unspecified: Hemoglobin was 9-10 over at the other facility.  Old labs from our system reviewed and showed hemoglobin of 8.2 and 2014.  She appears to have chronic anemia.  We will check anemia panel in the morning.  #5  Mild hyponatremia: Sodium was initially 130 at the outside facility improved to 134 this morning.  Continue to monitor.  She also has been noted to have hyponatremia previously.  #6  Abnormal UA: No growth on urine cultures from outside reports.  Patient denied any symptoms suggestive of UTI.  Hold off on antibiotics.  #7  Constipation: As was noted on CT scan done at the outside facility.  Patient will be given laxatives and stool softeners.  Check TSH.  #8 history of dementia: Further details not available.  Patient seems to be appropriate without any behavioral problems.  Continue to monitor.  DVT Prophylaxis: SCDs for now Code Status: Full code for now Family Communication: No family at bedside Consults called: Orthopedics.  Discussed with Dr. Aundria Rudogers who will get back to us regarding timing of surgery.  Severity of Illness: The  appropriate patient status for this patient is INPATIENT. Inpatient status is judged to be reasonable and necessary in order to provide the required intensity of service to ensure the patient's safety. The patient's presenting symptoms, physical exam findings, and initial radiographic and laboratory data in the context of their chronic comorbidities is felt to place them at high risk for further clinical deterioration. Furthermore, it is not anticipated that the patient will be medically stable for discharge from the hospital within 2 midnights of admission. The following factors support the patient status of inpatient.   " The patient's presenting symptoms include left hip pain. " The worrisome physical exam findings include externally rotated left lower extremity. " The initial radiographic and laboratory data are worrisome because of left hip fracture. " The chronic co-morbidities include hypertension.   * I certify that at the point of admission it is my clinical judgment that the patient will require inpatient hospital care spanning beyond 2 midnights from the point of admission due to high intensity of service, high risk for further deterioration and high frequency of surveillance required.*  Further management decisions will depend on results of further testing and patient's response to treatment.   Osvaldo ShipperGokul Terry Abila  [facility] Pager [phone]  If 7PM-7AM, please contact night-coverage [email or web address] Password [email or web address]  02/02/2017, 1:55 PM

## 2017-02-02 NOTE — Progress Notes (Signed)
Initial Nutrition Assessment  DOCUMENTATION CODES:   (Will assess for malnutrition at follow-up)  INTERVENTION:  - Diet advancement as medically feasible. - RD will attempt to obtain nutrition-related hx at follow-up.  NUTRITION DIAGNOSIS:   Inadequate oral intake related to inability to eat as evidenced by NPO status.  GOAL:   Patient will meet greater than or equal to 90% of their needs  MONITOR:   Diet advancement, Weight trends, Labs, Skin  REASON FOR ASSESSMENT:   Consult Hip fracture protocol  ASSESSMENT:   83 y.o. female with a past medical history of dementia, hypothyroidism, hypertension who was in her usual state of health until yesterday when she had a mechanical fall and could not get up.  She had pain in her left hip area.  She was taken to her local hospital where she was diagnosed with left hip fracture.   Most recent available weight is [facility]: 179 lbs/81.2 kg. Used this weight to estimate nutrition needs. Will update if new weight becomes available. Unable to talk with pt and/or family at this time. Plan is for L hip surgery during this admission. Will attempt to ask about nutrition at follow-up.  Medications reviewed; 100 mg Colace BID, 1 packet Miralax/day.  No labs available at this time.  IVF: NS @ 30 mL/hr.     NUTRITION - FOCUSED PHYSICAL EXAM: Will attempt to perform/assess at follow-up.    Diet Order:  Diet NPO time specified  EDUCATION NEEDS:   No education needs have been identified at this time  Skin:  Skin Assessment: Reviewed RN Assessment  Last BM:  PTA/unknown  Height:   Ht Readings from Last 1 Encounters:  04/26/12 5\' 4"  (1.626 m)    Weight:   Wt Readings from Last 1 Encounters:  04/26/12 181 lb (82.1 kg)    Ideal Body Weight:  52.27 kg  BMI:  There is no height or weight on file to calculate BMI.  Estimated Nutritional Needs:   Kcal:  1460-1625 (18-20 kcal/kg)  Protein:  50-60  grams  Fluid:  >/= 1.5 L/day      Trenton GammonJessica Larue Drawdy, MS, RD, LDN, [facility] Inpatient Clinical Dietitian Pager # [phone] After hours/weekend pager # [phone]

## 2017-02-03 DIAGNOSIS — S72002A Fracture of unspecified part of neck of left femur, initial encounter for closed fracture: Principal | ICD-10-CM

## 2017-02-03 DIAGNOSIS — E039 Hypothyroidism, unspecified: Secondary | ICD-10-CM

## 2017-02-03 DIAGNOSIS — I10 Essential (primary) hypertension: Secondary | ICD-10-CM

## 2017-02-03 LAB — BASIC METABOLIC PANEL
Anion gap: 8 (ref 5–15)
BUN: 11 mg/dL (ref 6–20)
CHLORIDE: 98 mmol/L — AB (ref 101–111)
CO2: 22 mmol/L (ref 22–32)
CREATININE: 0.61 mg/dL (ref 0.44–1.00)
Calcium: 9.2 mg/dL (ref 8.9–10.3)
GFR calc Af Amer: >60 mL/min (ref >60)
GFR calc non Af Amer: >60 mL/min (ref >60)
Glucose, Bld: 119 mg/dL — ABNORMAL HIGH (ref 65–99)
Potassium: 4 mmol/L (ref 3.5–5.1)
SODIUM: 128 mmol/L — AB (ref 135–145)

## 2017-02-03 LAB — CBC
HCT: 32.2 % — ABNORMAL LOW (ref 36.0–46.0)
HEMOGLOBIN: 10.4 g/dL — AB (ref 12.0–15.0)
MCH: 26.5 pg (ref 26.0–34.0)
MCHC: 32.3 g/dL (ref 30.0–36.0)
MCV: 82.1 fL (ref 78.0–100.0)
Platelets: 229 10*3/uL (ref 150–400)
RBC: 3.92 MIL/uL (ref 3.87–5.11)
RDW: 16.8 % — ABNORMAL HIGH (ref 11.5–15.5)
WBC: 8.5 10*3/uL (ref 4.0–10.5)

## 2017-02-03 LAB — RETICULOCYTES
RBC.: 3.92 MIL/uL (ref 3.87–5.11)
Retic Count, Absolute: 94.1 10*3/uL (ref 19.0–186.0)
Retic Ct Pct: 2.4 % (ref 0.4–3.1)

## 2017-02-03 LAB — IRON AND TIBC
Iron: 22 ug/dL — ABNORMAL LOW (ref 28–170)
SATURATION RATIOS: 6 % — AB (ref 10.4–31.8)
TIBC: 381 ug/dL (ref 250–450)
UIBC: 359 ug/dL

## 2017-02-03 LAB — TSH: TSH: 1.892 u[IU]/mL (ref 0.350–4.500)

## 2017-02-03 LAB — FERRITIN: Ferritin: 38 ng/mL (ref 11–307)

## 2017-02-03 LAB — FOLATE: FOLATE: 26 ng/mL (ref >5.9)

## 2017-02-03 LAB — VITAMIN B12: Vitamin B-12: 441 pg/mL (ref 180–914)

## 2017-02-03 MED ORDER — LEVOTHYROXINE SODIUM 100 MCG PO TABS
100.0000 ug | ORAL_TABLET | Freq: Every day | ORAL | Status: DC
  Administered 2017-02-05 – 2017-02-06 (×2): 100 ug via ORAL
  Filled 2017-02-03 (×2): qty 1

## 2017-02-03 NOTE — Progress Notes (Signed)
Subjective:   Procedure(s) (LRB): ARTHROPLASTY BIPOLAR HIP (HEMIARTHROPLASTY) VERSUS TOTAL LEFT (Left)  Patient reports pain as moderate to severe.  Reports that she is hungry and ready to eat this morning.  Admits to flatus.    Objective:   VITALS:  Temp:  [97.7 F (36.5 C)-99.1 F (37.3 C)] 99 F (37.2 C) (11/17 0500) Pulse Rate:  [90-102] 100 (11/17 0500) Resp:  [15-16] 16 (11/17 0500) BP: (147-174)/(62-74) 155/73 (11/17 0500) SpO2:  [93 %-95 %] 95 % (11/17 0500) Weight:  [87 kg (191 lb 12.8 oz)] 87 kg (191 lb 12.8 oz) (11/16 1325)  General: WDWN patient in NAD. Psych:  Appropriate mood and affect. Neuro:  A&O x 3, Moving all extremities, sensation intact to light touch HEENT:  EOMs intact Chest:  Even non-labored respirations Skin:  C/D/I, no rashes or lesions Extremities: warm/dry, mild edema, no erythema or echymosis.  No lymphadenopathy. Pulses: Dorsalis pedis and post tibialis 2+ MSK:  ROM: pain with any type of hip motion.  Full ankle ROM, MMT: patient is able to perform quad set with pain    LABS Recent Labs    02/03/17 0530  HGB 10.4*  WBC 8.5  PLT 229   Recent Labs    02/03/17 0530  NA 128*  K 4.0  CL 98*  CO2 22  BUN 11  CREATININE 0.61  GLUCOSE 119*   No results for input(s): LABPT, INR in the last 72 hours.   Assessment/Plan:   Procedure(s) (LRB): ARTHROPLASTY BIPOLAR HIP (HEMIARTHROPLASTY) VERSUS TOTAL LEFT (Left)  Patient seen in rounds for Dr. Charlann Boxerlin Plan to OR on Sunday 02/04/17 for L hip arthroplasty by Dr. Charlann Boxerlin NPO after midnight Continue to hold blood thinners and NSAIDs Bed rest PRN medications  Alfredo MartinezJustin Elliemae Braman, PA-C [facility] Office:  [phone]

## 2017-02-03 NOTE — Progress Notes (Signed)
PROGRESS NOTE    Arville LimeRosa L Koob  AVW:[id number]:[id number] DOB: November 25, 1931 DOA: 02/02/2017 PCP: System, Pcp Not In   Brief Narrative: 83 year old female with history of dementia, hypothyroidism, hypertension presented after a mechanical fall sustaining a left hip pain.  Patient was found to have left hip fracture.  [facility].  Plan for surgery on 11/18.  Assessment & Plan:   #Fall with left hip fracture/preoperative evaluation: Pain is controlled.  Continue supportive care.  Plan for surgery tomorrow as per orthopedic.  Keep n.p.o. from midnight.  SCD for DVT prophylaxis.  Perioperative evaluation done on admission.  No need for cardiac intervention prior to surgery.  #Hypothyroidism: Resume Synthroid.  TSH level acceptable.  #Chronic hyponatremia: Sodium level around baseline.  Check urine electrolytes and osmolality.  Monitor BMP.  DVT prophylaxis: SCD Code Status: Full code Family Communication: No family at bedside Disposition Plan: Admitted    Consultants:   Orthopedics  Procedures: None Antimicrobials: None  Subjective: Seen and examined at bedside.  Denies headache, dizziness, nausea vomiting chest pain shortness of breath.  Pain is controlled on current regimen.  Objective: Vitals:   02/02/17 1600 02/02/17 2233 02/03/17 0200 02/03/17 0500  BP: (!) 163/74 (!) 150/62 (!) 160/70 (!) 155/73  Pulse: 94 (!) 102 99 100  Resp: 16 16 16 16   Temp: 97.7 F (36.5 C) 99.1 F (37.3 C) 98.4 F (36.9 C) 99 F (37.2 C)  TempSrc: Axillary Axillary Axillary Axillary  SpO2:  93%  95%  Weight:      Height:        Intake/Output Summary (Last 24 hours) at 02/03/2017 1342 Last data filed at 02/03/2017 0659 Gross per 24 hour  Intake 410.5 ml  Output 1200 ml  Net -789.5 ml   Filed Weights   02/02/17 1325  Weight: 87 kg (191 lb 12.8 oz)    Examination:  General exam: Appears calm and comfortable  Respiratory system: Clear to  auscultation. Respiratory effort normal. No wheezing or crackle Cardiovascular system: S1 & S2 heard, RRR.  No pedal edema. Gastrointestinal system: Abdomen is nondistended, soft and nontender. Normal bowel sounds heard. Central nervous system: Alert awake and following commands Extremities: No edema.  No external injuries. Skin: No rashes, lesions or ulcers     Data Reviewed: I have personally reviewed following labs and imaging studies  CBC: Recent Labs  Lab 02/03/17 0530  WBC 8.5  HGB 10.4*  HCT 32.2*  MCV 82.1  PLT 229   Basic Metabolic Panel: Recent Labs  Lab 02/03/17 0530  NA 128*  K 4.0  CL 98*  CO2 22  GLUCOSE 119*  BUN 11  CREATININE 0.61  CALCIUM 9.2   GFR: Estimated Creatinine Clearance: 56.9 mL/min (by C-G formula based on SCr of 0.61 mg/dL). Liver Function Tests: No results for input(s): AST, ALT, ALKPHOS, BILITOT, PROT, ALBUMIN in the last 168 hours. No results for input(s): LIPASE, AMYLASE in the last 168 hours. No results for input(s): AMMONIA in the last 168 hours. Coagulation Profile: No results for input(s): INR, PROTIME in the last 168 hours. Cardiac Enzymes: No results for input(s): CKTOTAL, CKMB, CKMBINDEX, TROPONINI in the last 168 hours. BNP (last 3 results) No results for input(s): PROBNP in the last 8760 hours. HbA1C: No results for input(s): HGBA1C in the last 72 hours. CBG: No results for input(s): GLUCAP in the last 168 hours. Lipid Profile: No results for input(s): CHOL, HDL, LDLCALC, TRIG, CHOLHDL, LDLDIRECT in the last  72 hours. Thyroid Function Tests: Recent Labs    02/03/17 0530  TSH 1.892   Anemia Panel: Recent Labs    02/03/17 0530  VITAMINB12 441  FOLATE 26.0  FERRITIN 38  TIBC 381  IRON 22*  RETICCTPCT 2.4   Sepsis Labs: No results for input(s): PROCALCITON, LATICACIDVEN in the last 168 hours.  Recent Results (from the past 240 hour(s))  Surgical PCR screen     Status: Abnormal   Collection Time: 02/02/17   2:38 PM  Result Value Ref Range Status   MRSA, PCR POSITIVE (A) NEGATIVE Final    Comment: RESULT CALLED TO, READ BACK BY AND VERIFIED WITH: TAYLOR,J. RN @1727  ON 11.16.18 BY COHEN,K    Staphylococcus aureus POSITIVE (A) NEGATIVE Final    Comment: (NOTE) The Xpert SA Assay (FDA approved for NASAL specimens in patients 81 years of age and older), is one component of a comprehensive surveillance program. It is not intended to diagnose infection nor to guide or monitor treatment.          Radiology Studies: Dg Chest 1 View  Result Date: 02/02/2017 CLINICAL DATA:  Preop, hip fracture EXAM: CHEST 1 VIEW COMPARISON:  11/15/ 2018 FINDINGS: No acute pulmonary infiltrate or effusion. Normal cardiomediastinal silhouette with aortic atherosclerosis. Negative for a pneumothorax. Chronic appearing deformity of the right humeral head. IMPRESSION: No active disease. Electronically Signed   By: Jasmine PangKim  Fujinaga M.D.   On: 02/02/2017 18:52   Dg Outside Films Chest  Result Date: 02/02/2017 This examination belongs to an outside facility and is stored here for comparison purposes only.  Contact the originating outside institution for any associated report or interpretation.  Dg Outside Films Extremity  Result Date: 02/02/2017 This examination belongs to an outside facility and is stored here for comparison purposes only.  Contact the originating outside institution for any associated report or interpretation.       Scheduled Meds: . docusate sodium  100 mg Oral BID  . [START ON 02/04/2017] levothyroxine  100 mcg Oral QAC breakfast  . polyethylene glycol  17 g Oral Daily   Continuous Infusions: . sodium chloride 30 mL/hr at 02/02/17 1425  . methocarbamol (ROBAXIN)  IV Stopped (02/02/17 1710)     LOS: 1 day    [place] Jaynie CollinsPrasad Dmario Russom, MD [facility] Pager [phone]  If 7PM-7AM, please contact night-coverage [email or web address] Password [email or web address] 02/03/2017, 1:42 PM

## 2017-02-04 ENCOUNTER — Inpatient Hospital Stay (HOSPITAL_COMMUNITY): Payer: Medicare HMO | Admitting: Certified Registered Nurse Anesthetist

## 2017-02-04 ENCOUNTER — Encounter (HOSPITAL_COMMUNITY): Payer: Self-pay | Admitting: Certified Registered Nurse Anesthetist

## 2017-02-04 ENCOUNTER — Inpatient Hospital Stay (HOSPITAL_COMMUNITY): Payer: Medicare HMO

## 2017-02-04 ENCOUNTER — Encounter (HOSPITAL_COMMUNITY)
Admission: AD | Disposition: A | Discharge status: Skilled Nursing Facility | Payer: Self-pay | Source: Other Acute Inpatient Hospital | Attending: Nephrology

## 2017-02-04 DIAGNOSIS — S72002G Fracture of unspecified part of neck of left femur, subsequent encounter for closed fracture with delayed healing: Secondary | ICD-10-CM

## 2017-02-04 DIAGNOSIS — Z96642 Presence of left artificial hip joint: Secondary | ICD-10-CM | POA: Insufficient documentation

## 2017-02-04 DIAGNOSIS — E871 Hypo-osmolality and hyponatremia: Secondary | ICD-10-CM

## 2017-02-04 HISTORY — PX: HIP ARTHROPLASTY: SHX981

## 2017-02-04 LAB — BASIC METABOLIC PANEL
Anion gap: 6 (ref 5–15)
BUN: 17 mg/dL (ref 6–20)
CO2: 24 mmol/L (ref 22–32)
Calcium: 8.8 mg/dL — ABNORMAL LOW (ref 8.9–10.3)
Chloride: 95 mmol/L — ABNORMAL LOW (ref 101–111)
Creatinine, Ser: 0.81 mg/dL (ref 0.44–1.00)
GFR calc Af Amer: >60 mL/min (ref >60)
GLUCOSE: 119 mg/dL — AB (ref 65–99)
POTASSIUM: 4.6 mmol/L (ref 3.5–5.1)
Sodium: 125 mmol/L — ABNORMAL LOW (ref 135–145)

## 2017-02-04 LAB — CBC
HEMATOCRIT: 30.4 % — AB (ref 36.0–46.0)
Hemoglobin: 9.7 g/dL — ABNORMAL LOW (ref 12.0–15.0)
MCH: 26.2 pg (ref 26.0–34.0)
MCHC: 31.9 g/dL (ref 30.0–36.0)
MCV: 82.2 fL (ref 78.0–100.0)
Platelets: 215 10*3/uL (ref 150–400)
RBC: 3.7 MIL/uL — ABNORMAL LOW (ref 3.87–5.11)
RDW: 16.9 % — AB (ref 11.5–15.5)
WBC: 7.8 10*3/uL (ref 4.0–10.5)

## 2017-02-04 LAB — OSMOLALITY, URINE: OSMOLALITY UR: 649 mosm/kg (ref 300–900)

## 2017-02-04 LAB — NA AND K (SODIUM & POTASSIUM), RAND UR
Potassium Urine: 42 mmol/L
Sodium, Ur: 130 mmol/L

## 2017-02-04 SURGERY — HEMIARTHROPLASTY, HIP, DIRECT ANTERIOR APPROACH, FOR FRACTURE
Anesthesia: Spinal | Site: Hip | Laterality: Left

## 2017-02-04 MED ORDER — FENTANYL CITRATE (PF) 100 MCG/2ML IJ SOLN
INTRAMUSCULAR | Status: AC
  Filled 2017-02-04: qty 2

## 2017-02-04 MED ORDER — EPHEDRINE SULFATE-NACL 50-0.9 MG/10ML-% IV SOSY
PREFILLED_SYRINGE | INTRAVENOUS | Status: DC | PRN
Start: 2017-02-04 — End: 2017-02-04
  Administered 2017-02-04: 5 mg via INTRAVENOUS
  Administered 2017-02-04: 10 mg via INTRAVENOUS

## 2017-02-04 MED ORDER — HYDROMORPHONE HCL 1 MG/ML IJ SOLN: 0.2500 mg | INTRAMUSCULAR | Status: DC | PRN

## 2017-02-04 MED ORDER — SODIUM CHLORIDE 0.9 % IV BOLUS (SEPSIS)
500.0000 mL | Freq: Once | INTRAVENOUS | Status: AC
  Administered 2017-02-04: 500 mL via INTRAVENOUS

## 2017-02-04 MED ORDER — CEFAZOLIN SODIUM-DEXTROSE 2-4 GM/100ML-% IV SOLN
2.0000 g | INTRAVENOUS | Status: AC
  Administered 2017-02-04: 2 g via INTRAVENOUS

## 2017-02-04 MED ORDER — POLYETHYLENE GLYCOL 3350 17 G PO PACK: 17.0000 g | PACK | Freq: Every day | ORAL | Status: DC | PRN

## 2017-02-04 MED ORDER — DEXAMETHASONE SODIUM PHOSPHATE 4 MG/ML IJ SOLN
INTRAMUSCULAR | Status: DC | PRN
  Administered 2017-02-04: 8 mg via INTRAVENOUS

## 2017-02-04 MED ORDER — ASPIRIN EC 325 MG PO TBEC
325.0000 mg | DELAYED_RELEASE_TABLET | Freq: Two times a day (BID) | ORAL | Status: DC
  Administered 2017-02-05 – 2017-02-06 (×3): 325 mg via ORAL
  Filled 2017-02-04 (×3): qty 1

## 2017-02-04 MED ORDER — METOCLOPRAMIDE HCL 5 MG/ML IJ SOLN: 5.0000 mg | Freq: Three times a day (TID) | INTRAMUSCULAR | Status: DC | PRN

## 2017-02-04 MED ORDER — PROPOFOL 500 MG/50ML IV EMUL
INTRAVENOUS | Status: DC | PRN
  Administered 2017-02-04: 25 ug/kg/min via INTRAVENOUS

## 2017-02-04 MED ORDER — LIDOCAINE 2% (20 MG/ML) 5 ML SYRINGE
INTRAMUSCULAR | Status: AC
Start: 2017-02-04 — End: ?
  Filled 2017-02-04: qty 5

## 2017-02-04 MED ORDER — ONDANSETRON HCL 4 MG/2ML IJ SOLN
INTRAMUSCULAR | Status: DC | PRN
  Administered 2017-02-04: 4 mg via INTRAVENOUS

## 2017-02-04 MED ORDER — PHENYLEPHRINE HCL 10 MG/ML IJ SOLN
INTRAMUSCULAR | Status: AC
  Filled 2017-02-04: qty 2

## 2017-02-04 MED ORDER — LACTATED RINGERS IV SOLN
INTRAVENOUS | Status: DC | PRN
Start: 2017-02-04 — End: 2017-02-04
  Administered 2017-02-04: 11:00:00 via INTRAVENOUS

## 2017-02-04 MED ORDER — PHENYLEPHRINE 40 MCG/ML (10ML) SYRINGE FOR IV PUSH (FOR BLOOD PRESSURE SUPPORT)
PREFILLED_SYRINGE | INTRAVENOUS | Status: DC | PRN
  Administered 2017-02-04 (×3): 80 ug via INTRAVENOUS

## 2017-02-04 MED ORDER — CEFAZOLIN SODIUM-DEXTROSE 2-4 GM/100ML-% IV SOLN
2.0000 g | Freq: Four times a day (QID) | INTRAVENOUS | Status: AC
  Administered 2017-02-04 (×2): 2 g via INTRAVENOUS
  Filled 2017-02-04 (×2): qty 100

## 2017-02-04 MED ORDER — POVIDONE-IODINE 10 % EX SWAB: 2.0000 "application " | Freq: Once | CUTANEOUS | Status: DC

## 2017-02-04 MED ORDER — BISACODYL 10 MG RE SUPP: 10.0000 mg | Freq: Every day | RECTAL | Status: DC | PRN

## 2017-02-04 MED ORDER — DEXAMETHASONE SODIUM PHOSPHATE 10 MG/ML IJ SOLN
INTRAMUSCULAR | Status: AC
  Filled 2017-02-04: qty 1

## 2017-02-04 MED ORDER — MUPIROCIN 2 % EX OINT
1.0000 "application " | TOPICAL_OINTMENT | Freq: Two times a day (BID) | CUTANEOUS | Status: DC
  Administered 2017-02-04 – 2017-02-06 (×5): 1 via NASAL
  Filled 2017-02-04: qty 22

## 2017-02-04 MED ORDER — ONDANSETRON HCL 4 MG/2ML IJ SOLN
INTRAMUSCULAR | Status: AC
  Filled 2017-02-04: qty 2

## 2017-02-04 MED ORDER — CEFAZOLIN SODIUM-DEXTROSE 2-4 GM/100ML-% IV SOLN
INTRAVENOUS | Status: AC
  Filled 2017-02-04: qty 100

## 2017-02-04 MED ORDER — PROPOFOL 10 MG/ML IV BOLUS
INTRAVENOUS | Status: AC
  Filled 2017-02-04: qty 20

## 2017-02-04 MED ORDER — ONDANSETRON HCL 4 MG/2ML IJ SOLN: 4.0000 mg | Freq: Four times a day (QID) | INTRAMUSCULAR | Status: DC | PRN

## 2017-02-04 MED ORDER — PROPOFOL 10 MG/ML IV BOLUS
INTRAVENOUS | Status: DC | PRN
  Administered 2017-02-04: 20 mg via INTRAVENOUS

## 2017-02-04 MED ORDER — SODIUM CHLORIDE 0.9 % IV SOLN
INTRAVENOUS | Status: DC
  Administered 2017-02-04: 14:00:00 via INTRAVENOUS

## 2017-02-04 MED ORDER — PHENOL 1.4 % MT LIQD: 1.0000 | OROMUCOSAL | Status: DC | PRN

## 2017-02-04 MED ORDER — FERROUS SULFATE 325 (65 FE) MG PO TABS
325.0000 mg | ORAL_TABLET | Freq: Three times a day (TID) | ORAL | Status: DC
  Administered 2017-02-05 – 2017-02-06 (×4): 325 mg via ORAL
  Filled 2017-02-04 (×4): qty 1

## 2017-02-04 MED ORDER — 0.9 % SODIUM CHLORIDE (POUR BTL) OPTIME
TOPICAL | Status: DC | PRN
  Administered 2017-02-04: 1000 mL

## 2017-02-04 MED ORDER — MEPERIDINE HCL 50 MG/ML IJ SOLN: 6.2500 mg | INTRAMUSCULAR | Status: DC | PRN

## 2017-02-04 MED ORDER — FENTANYL CITRATE (PF) 100 MCG/2ML IJ SOLN
INTRAMUSCULAR | Status: DC | PRN
  Administered 2017-02-04 (×2): 25 ug via INTRAVENOUS

## 2017-02-04 MED ORDER — ONDANSETRON HCL 4 MG/2ML IJ SOLN: 4.0000 mg | Freq: Once | INTRAMUSCULAR | Status: DC | PRN

## 2017-02-04 MED ORDER — MENTHOL 3 MG MT LOZG: 1.0000 | LOZENGE | OROMUCOSAL | Status: DC | PRN

## 2017-02-04 MED ORDER — MAGNESIUM CITRATE PO SOLN: 1.0000 | Freq: Once | ORAL | Status: DC | PRN

## 2017-02-04 MED ORDER — METOCLOPRAMIDE HCL 5 MG PO TABS: 5.0000 mg | ORAL_TABLET | Freq: Three times a day (TID) | ORAL | Status: DC | PRN

## 2017-02-04 MED ORDER — ONDANSETRON HCL 4 MG PO TABS: 4.0000 mg | ORAL_TABLET | Freq: Four times a day (QID) | ORAL | Status: DC | PRN

## 2017-02-04 MED ORDER — PHENYLEPHRINE HCL 10 MG/ML IJ SOLN
INTRAMUSCULAR | Status: DC | PRN
  Administered 2017-02-04: 50 ug/min via INTRAVENOUS

## 2017-02-04 MED ORDER — TRANEXAMIC ACID 1000 MG/10ML IV SOLN
1000.0000 mg | INTRAVENOUS | Status: AC
  Administered 2017-02-04: 1000 mg via INTRAVENOUS
  Filled 2017-02-04: qty 1100

## 2017-02-04 MED ORDER — CHLORHEXIDINE GLUCONATE 4 % EX LIQD: 60.0000 mL | Freq: Once | CUTANEOUS | Status: DC

## 2017-02-04 MED ORDER — CHLORHEXIDINE GLUCONATE CLOTH 2 % EX PADS
6.0000 | MEDICATED_PAD | Freq: Every day | CUTANEOUS | Status: DC
  Administered 2017-02-04 – 2017-02-06 (×2): 6 via TOPICAL

## 2017-02-04 SURGICAL SUPPLY — 51 items
BAG ZIPLOCK 12X15 (MISCELLANEOUS) ×3 IMPLANT
BLADE SAW SGTL 11.0X1.19X90.0M (BLADE) ×3 IMPLANT
BLADE SAW SGTL 18X1.27X75 (BLADE) ×2 IMPLANT
BLADE SAW SGTL 18X1.27X75MM (BLADE) ×1
CAPT HIP HEMI 2 ×3 IMPLANT
CLOSURE WOUND 1/2 X4 (GAUZE/BANDAGES/DRESSINGS) ×2
COVER SURGICAL LIGHT HANDLE (MISCELLANEOUS) ×3 IMPLANT
DERMABOND ADVANCED (GAUZE/BANDAGES/DRESSINGS) ×2
DERMABOND ADVANCED .7 DNX12 (GAUZE/BANDAGES/DRESSINGS) ×1 IMPLANT
DRAPE INCISE IOBAN 85X60 (DRAPES) ×3 IMPLANT
DRAPE ORTHO SPLIT 77X108 STRL (DRAPES) ×4
DRAPE POUCH INSTRU U-SHP 10X18 (DRAPES) ×3 IMPLANT
DRAPE SURG 17X11 SM STRL (DRAPES) ×3 IMPLANT
DRAPE SURG ORHT 6 SPLT 77X108 (DRAPES) ×2 IMPLANT
DRAPE U-SHAPE 47X51 STRL (DRAPES) ×3 IMPLANT
DRSG AQUACEL AG ADV 3.5X10 (GAUZE/BANDAGES/DRESSINGS) ×3 IMPLANT
DRSG TEGADERM 4X4.75 (GAUZE/BANDAGES/DRESSINGS) ×3 IMPLANT
DURAPREP 26ML APPLICATOR (WOUND CARE) ×3 IMPLANT
ELECT BLADE TIP CTD 4 INCH (ELECTRODE) ×3 IMPLANT
ELECT REM PT RETURN 15FT ADLT (MISCELLANEOUS) ×3 IMPLANT
EVACUATOR 1/8 PVC DRAIN (DRAIN) IMPLANT
FACESHIELD WRAPAROUND (MASK) ×12 IMPLANT
GAUZE SPONGE 2X2 8PLY STRL LF (GAUZE/BANDAGES/DRESSINGS) ×1 IMPLANT
GLOVE BIOGEL M 7.0 STRL (GLOVE) IMPLANT
GLOVE BIOGEL PI IND STRL 7.5 (GLOVE) ×1 IMPLANT
GLOVE BIOGEL PI IND STRL 8.5 (GLOVE) ×1 IMPLANT
GLOVE BIOGEL PI INDICATOR 7.5 (GLOVE) ×2
GLOVE BIOGEL PI INDICATOR 8.5 (GLOVE) ×2
GLOVE ECLIPSE 8.0 STRL XLNG CF (GLOVE) ×3 IMPLANT
GLOVE ORTHO TXT STRL SZ7.5 (GLOVE) ×6 IMPLANT
GLOVE SURG ORTHO 8.0 STRL STRW (GLOVE) ×3 IMPLANT
GOWN STRL REUS W/TWL LRG LVL3 (GOWN DISPOSABLE) ×3 IMPLANT
GOWN STRL REUS W/TWL XL LVL3 (GOWN DISPOSABLE) ×6 IMPLANT
HANDPIECE INTERPULSE COAX TIP (DISPOSABLE)
IMMOBILIZER KNEE 20 (SOFTGOODS)
IMMOBILIZER KNEE 20 THIGH 36 (SOFTGOODS) IMPLANT
KIT BASIN OR (CUSTOM PROCEDURE TRAY) IMPLANT
MANIFOLD NEPTUNE II (INSTRUMENTS) ×3 IMPLANT
PACK TOTAL JOINT (CUSTOM PROCEDURE TRAY) ×3 IMPLANT
POSITIONER SURGICAL ARM (MISCELLANEOUS) ×3 IMPLANT
SET HNDPC FAN SPRY TIP SCT (DISPOSABLE) IMPLANT
SPONGE GAUZE 2X2 STER 10/PKG (GAUZE/BANDAGES/DRESSINGS) ×2
STRIP CLOSURE SKIN 1/2X4 (GAUZE/BANDAGES/DRESSINGS) ×4 IMPLANT
SUT ETHIBOND NAB CT1 #1 30IN (SUTURE) ×3 IMPLANT
SUT MNCRL AB 4-0 PS2 18 (SUTURE) ×3 IMPLANT
SUT VIC AB 1 CT1 36 (SUTURE) ×6 IMPLANT
SUT VIC AB 2-0 CT1 27 (SUTURE) ×4
SUT VIC AB 2-0 CT1 TAPERPNT 27 (SUTURE) ×2 IMPLANT
SUT VLOC 180 0 24IN GS25 (SUTURE) IMPLANT
TOWEL OR 17X26 10 PK STRL BLUE (TOWEL DISPOSABLE) ×6 IMPLANT
TRAY FOLEY W/METER SILVER 16FR (SET/KITS/TRAYS/PACK) IMPLANT

## 2017-02-04 NOTE — Progress Notes (Signed)
     Subjective: Day of Surgery Procedure(s) (LRB): ARTHROPLASTY BIPOLAR HIP (HEMIARTHROPLASTY) VERSUS TOTAL LEFT (Left)    83-year-old female with history of dementia, hypothyroidism, hypertension presented after a mechanical fall sustaining a left hip pain.  Patient was found to have left hip fracture.  Patient reports pain as mild, unless the leg moves.  It was significantly worse until treated with medication. Discussed surgery intervention and she does wish to proceed.  Risks, benefits and expectations were discussed with the patient.  Risks including but not limited to the risk of anesthesia, blood clots, nerve damage, blood vessel damage, failure of the prosthesis, infection and up to and including death.  Patient understand the risks, benefits and expectations and wishes to proceed with surgery.   Objective:   VITALS:   Vitals:   02/03/17 2157 02/04/17 0542  BP: (!) 147/64 (!) 155/64  Pulse: 97 (!) 107  Resp: 16 15  Temp: 98.7 F (37.1 C) 98.2 F (36.8 C)  SpO2: 95% 93%    Neurovascular intact Dorsiflexion/Plantar flexion intact  LABS Recent Labs    02/03/17 0530 02/04/17 0440  HGB 10.4* 9.7*  HCT 32.2* 30.4*  WBC 8.5 7.8  PLT 229 215    Recent Labs    02/03/17 0530 02/04/17 0440  NA 128* 125*  K 4.0 4.6  BUN 11 17  CREATININE 0.61 0.81  GLUCOSE 119* 119*     Assessment/Plan: Day of Surgery Procedure(s) (LRB): ARTHROPLASTY BIPOLAR HIP (HEMIARTHROPLASTY) VERSUS TOTAL LEFT (Left)  NPO now Plan on surgery today per Dr. Olin.     Shalene Gallen S. Daden Mahany   PAC  02/04/2017, 8:31 AM  

## 2017-02-04 NOTE — Op Note (Signed)
[name]:  Lassen, Tina Wheeler                ACCOUNT NO.:  [id number]   MEDICAL RECORD NO.: [id number]   LOCATION:  1435                         [facility]   DATE OF BIRTH:  12/20/2031  PHYSICIAN:  Madlyn FrankelMatthew D. Charlann Boxerlin, M.D.     DATE OF PROCEDURE:  02/04/17                               OPERATIVE REPORT     PREOPERATIVE DIAGNOSIS:  Left displaced femoral neck fracture.   POSTOPERATIVE DIAGNOSIS:  Left displaced femoral neck fracture.   PROCEDURE:  Left hip hemiarthroplasty utilizing DePuy component, size 7 standard Tri-Lock stem with a 46mm unipolar ball with a +5 adapter.   SURGEON:  Madlyn FrankelMatthew D. Charlann Boxerlin, MD   ASSISTANT:  Lanney GinsMatthew Babish, PA-C.   ANESTHESIA:  General.   SPECIMENS:  None.   DRAINS:  None.   BLOOD LOSS:  About 100 cc.   COMPLICATIONS:  None.   INDICATION OF PROCEDURE:  Tina Wheeler is a pleasant 83 year old female who lives Independently with her family.  She unfortunately had a fall at her house trying to dress.  She had immediate pain and inability to bear weight.  She was admitted to the hospital after radiographs revealed a femoral neck fracture.  She was seen and evaluated and was scheduled for surgery for fixation.  The necessity of surgical repair was discussed with she and her family.  Consent was obtained after reviewing risks of infection, DVT, component failure, and need for revision surgery.   PROCEDURE IN DETAIL:  The patient was brought to the operative theater. Once adequate anesthesia, preoperative antibiotics, 2 g of Ancef, 1 gm of Tranexamic Acid, and 10 mg of Decadron administered, the patient was positioned into the right lateral decubitus position with the left side up.  The left lower extremity was then prepped and draped in sterile fashion.  A time-out was performed identifying the patient, planned procedure, and extremity.   A lateral incision was made off the proximal trochanter. Sharp dissection was carried down to the iliotibial band and gluteal  fascia. The gluteal fascia was then incised for posterior approach.  The short external rotators were taken down separate from the posterior capsule. An L capsulotomy was made preserving the posterior leaflet for later anatomic repair. Fracture site was identified and after removing comminuted segments of the posterior femoral neck, the femoral head was removed without difficulty and measured on the back table  using the sizing rings and determined to be 46mm mm in diameter.   The proximal femur was then exposed.  Retractors placed.  I then drilled, opened the proximal femur.  Then I hand reamed once and  Irrigated the canal to try to prevent fat emboli.  I began broaching the femur with a starter broach up to a size 7 broach with good medial and lateral metaphyseal fit without evidence of any torsion or movement.  A trial reduction was carried out with a standard offset neck based on appearance of her previously revised right THR and a +0 then +5 adapter with a 46mm ball.  The hip reduced nicely.  The leg lengths appeared to be equal compared to the down Leg and importantly her hip felt stable.   The  hip went through a range of motion without evidence of any subluxation or impingement.   Given these findings, the trial components removed.  The final 7 standard  Tri-Lock stem was opened.  After irrigating the canal, the final stem was impacted and sat at the level where the broach was. Based on this and the trial reduction, a +5 adapter was opened and impacted in the 46mm unipolar ball onto a clean and dry trunnion.  The hip had been irrigated throughout the case and again at this point.  I re- Approximated the posterior capsule to the superior leaflet using a  #1 Vicryl.  The remainder of the wound was closed with #1 Vicryl and #0 Stratafix sutures in the iliotibial band and gluteal fascia, a  2-0 Vicryl in the sub-Q tissue and a running 4-0 Monocryl in the skin.  The hip was cleaned,  dried, and dressed sterilely using Dermabond and Aquacel dressing.  She was then brought to recovery room, extubated in stable condition, tolerating the procedure well.  Lanney GinsMatthew Babish, PA-C was present and utilized as Geophysicist/field seismologistassistant for the entire case from  Preoperative positioning to management of the contralateral extremity and retractors to  General facilitation of the procedure.  He was also involved with primary wound closure.         Madlyn FrankelMatthew D. Charlann Boxerlin, M.D.

## 2017-02-04 NOTE — Interval H&P Note (Signed)
History and Physical Interval Note:  02/04/2017 11:13 AM  Tina Wheeler  has presented today for surgery, with the diagnosis of left hip fracture  The various methods of treatment have been discussed with the patient and family. After consideration of risks, benefits and other options for treatment, the patient has consented to  Procedure(s): ARTHROPLASTY BIPOLAR HIP (HEMIARTHROPLASTY) VERSUS TOTAL LEFT (Left) as a surgical intervention .  The patient's history has been reviewed, patient examined, no change in status, stable for surgery.  I have reviewed the patient's chart and labs.  Questions were answered to the patient's satisfaction.     Shelda PalMatthew D Quincie Haroon

## 2017-02-04 NOTE — Anesthesia Preprocedure Evaluation (Signed)
Anesthesia Evaluation  Patient identified by MRN, date of birth, ID band Patient awake    Reviewed: Allergy & Precautions, NPO status , Patient's Chart, lab work & pertinent test results  Airway Mallampati: II  TM Distance: >3 FB Neck ROM: Full    Dental   Pulmonary former smoker,    Pulmonary exam normal        Cardiovascular hypertension, Pt. on medications Normal cardiovascular exam     Neuro/Psych    GI/Hepatic   Endo/Other  Hypothyroidism   Renal/GU      Musculoskeletal   Abdominal   Peds  Hematology   Anesthesia Other Findings   Reproductive/Obstetrics                             Anesthesia Physical Anesthesia Plan  ASA: III  Anesthesia Plan: General   Post-op Pain Management:    Induction: Intravenous  PONV Risk Score and Plan: 3 and Treatment may vary due to age or medical condition, Ondansetron and Dexamethasone  Airway Management Planned: Oral ETT  Additional Equipment:   Intra-op Plan:   Post-operative Plan: Extubation in OR  Informed Consent: I have reviewed the patients History and Physical, chart, labs and discussed the procedure including the risks, benefits and alternatives for the proposed anesthesia with the patient or authorized representative who has indicated his/her understanding and acceptance.     Plan Discussed with: CRNA and Surgeon  Anesthesia Plan Comments:         Anesthesia Quick Evaluation

## 2017-02-04 NOTE — Anesthesia Postprocedure Evaluation (Signed)
Anesthesia Post Note  Patient: Tina Wheeler  Procedure(s) Performed: ARTHROPLASTY HEMIARTHROPLASTY LEFT hip (Left Hip)     Patient location during evaluation: PACU Anesthesia Type: General Level of consciousness: oriented and awake and alert Pain management: pain level controlled Vital Signs Assessment: post-procedure vital signs reviewed and stable Respiratory status: spontaneous breathing, respiratory function stable and patient connected to nasal cannula oxygen Cardiovascular status: blood pressure returned to baseline and stable Postop Assessment: no headache, no backache and no apparent nausea or vomiting Anesthetic complications: no    Last Vitals:  Vitals:   02/04/17 1300 02/04/17 1330  BP: (!) 93/47 (!) 99/43  Pulse: 88   Resp: 16   Temp:    SpO2: 96%     Last Pain:  Vitals:   02/04/17 1315  TempSrc:   PainSc: Asleep      LLE Sensation: No sensation (absent) (02/04/17 1345)   RLE Sensation: No sensation (absent) (02/04/17 1345) L Sensory Level: T12-Inguinal (groin) region (02/04/17 1345) R Sensory Level: T12-Inguinal (groin) region (02/04/17 1345)  Bayley Yarborough DAVID

## 2017-02-04 NOTE — Progress Notes (Signed)
Pre-OP Nursing Note: pt arrived into pre-op on bed, safety measures in place with sr x 4 up. Pt identified by arm band, with DOB, Name, MRN#. Pt was also able to state name and DOB and stated she was having hip surgery. Pt appears comfortable at this time. IV site WNL. MD and MDA both have seen patient. Comfort measures provided

## 2017-02-04 NOTE — Progress Notes (Signed)
PROGRESS NOTE    Tina Wheeler  UJW:[id number] DOB: 02/07/1932 DOA: 02/02/2017 PCP: System, Pcp Not In   Brief Narrative: 83 year old female with history of dementia, hypothyroidism, hypertension presented after a mechanical fall sustaining a left hip pain.  Patient was found to have left hip fracture.  [facility].  Plan for surgery on 11/18.  Assessment & Plan:   #Fall with left hip fracture/preoperative evaluation: Pain is controlled.  Continue supportive care.  Plan for surgery todayas per orthopedic. Pt is NPO.  SCD for DVT prophylaxis.  Perioperative evaluation done on admission.  No need for cardiac intervention prior to surgery.  #Hypothyroidism: Continue Synthroid.  TSH level acceptable.  #Chronic hyponatremia: Sodium level around baseline.  Patient has chronic hyponatremia on reviewing her chart.  Check urine electrolytes and osmolality.  Monitor BMP.  DVT prophylaxis: SCD Code Status: Full code Family Communication: No family at bedside Disposition Plan: Admitted    Consultants:   Orthopedics  Procedures: None Antimicrobials: None  Subjective: Seen and examined at bedside.  No new event.  Denies headache, dizziness, nausea vomiting chest pain.  No pain.  Objective: Vitals:   02/03/17 1448 02/03/17 2157 02/04/17 0542 02/04/17 1005  BP: (!) 172/53 (!) 147/64 (!) 155/64 (!) 171/62  Pulse: (!) 102 97 (!) 107 100  Resp: 16 16 15 20   Temp: 98.6 F (37 C) 98.7 F (37.1 C) 98.2 F (36.8 C) 100.2 F (37.9 C)  TempSrc:  Oral Oral Oral  SpO2: 97% 95% 93% 94%  Weight:      Height:        Intake/Output Summary (Last 24 hours) at 02/04/2017 1052 Last data filed at 02/04/2017 1000 Gross per 24 hour  Intake 1644.3 ml  Output 1275 ml  Net 369.3 ml   Filed Weights   02/02/17 1325  Weight: 87 kg (191 lb 12.8 oz)    Examination:  General exam: Elderly female lying in bed comfortable, not in  distress Respiratory system: Clear bilateral, respiratory effort normal Cardiovascular system: Regular rate rhythm, S1-S2 normal.  No pedal edema. Gastrointestinal system: abdomen soft, nontender.  Bowel sounds positive Central nervous system: Alert awake and following commands Extremities: No edema.  No external injuries. Skin: No rashes, lesions or ulcers     Data Reviewed: I have personally reviewed following labs and imaging studies  CBC: Recent Labs  Lab 02/03/17 0530 02/04/17 0440  WBC 8.5 7.8  HGB 10.4* 9.7*  HCT 32.2* 30.4*  MCV 82.1 82.2  PLT 229 215   Basic Metabolic Panel: Recent Labs  Lab 02/03/17 0530 02/04/17 0440  NA 128* 125*  K 4.0 4.6  CL 98* 95*  CO2 22 24  GLUCOSE 119* 119*  BUN 11 17  CREATININE 0.61 0.81  CALCIUM 9.2 8.8*   GFR: Estimated Creatinine Clearance: 56.2 mL/min (by C-G formula based on SCr of 0.81 mg/dL). Liver Function Tests: No results for input(s): AST, ALT, ALKPHOS, BILITOT, PROT, ALBUMIN in the last 168 hours. No results for input(s): LIPASE, AMYLASE in the last 168 hours. No results for input(s): AMMONIA in the last 168 hours. Coagulation Profile: No results for input(s): INR, PROTIME in the last 168 hours. Cardiac Enzymes: No results for input(s): CKTOTAL, CKMB, CKMBINDEX, TROPONINI in the last 168 hours. BNP (last 3 results) No results for input(s): PROBNP in the last 8760 hours. HbA1C: No results for input(s): HGBA1C in the last 72 hours. CBG: No results for input(s): GLUCAP in the last  168 hours. Lipid Profile: No results for input(s): CHOL, HDL, LDLCALC, TRIG, CHOLHDL, LDLDIRECT in the last 72 hours. Thyroid Function Tests: Recent Labs    02/03/17 0530  TSH 1.892   Anemia Panel: Recent Labs    02/03/17 0530  VITAMINB12 441  FOLATE 26.0  FERRITIN 38  TIBC 381  IRON 22*  RETICCTPCT 2.4   Sepsis Labs: No results for input(s): PROCALCITON, LATICACIDVEN in the last 168 hours.  Recent Results (from the  past 240 hour(s))  Surgical PCR screen     Status: Abnormal   Collection Time: 02/02/17  2:38 PM  Result Value Ref Range Status   MRSA, PCR POSITIVE (A) NEGATIVE Final    Comment: RESULT CALLED TO, READ BACK BY AND VERIFIED WITH: TAYLOR,J. RN @1727  ON 11.16.18 BY COHEN,K    Staphylococcus aureus POSITIVE (A) NEGATIVE Final    Comment: (NOTE) The Xpert SA Assay (FDA approved for NASAL specimens in patients 81 years of age and older), is one component of a comprehensive surveillance program. It is not intended to diagnose infection nor to guide or monitor treatment.          Radiology Studies: Dg Chest 1 View  Result Date: 02/02/2017 CLINICAL DATA:  Preop, hip fracture EXAM: CHEST 1 VIEW COMPARISON:  11/15/ 2018 FINDINGS: No acute pulmonary infiltrate or effusion. Normal cardiomediastinal silhouette with aortic atherosclerosis. Negative for a pneumothorax. Chronic appearing deformity of the right humeral head. IMPRESSION: No active disease. Electronically Signed   By: Jasmine PangKim  Fujinaga M.D.   On: 02/02/2017 18:52        Scheduled Meds: . chlorhexidine  60 mL Topical Once  . Chlorhexidine Gluconate Cloth  6 each Topical Q0600  . docusate sodium  100 mg Oral BID  . levothyroxine  100 mcg Oral QAC breakfast  . mupirocin ointment  1 application Nasal BID  . polyethylene glycol  17 g Oral Daily  . povidone-iodine  2 application Topical Once   Continuous Infusions: . sodium chloride 30 mL/hr at 02/03/17 2015  . ceFAZolin    .  ceFAZolin (ANCEF) IV    . methocarbamol (ROBAXIN)  IV Stopped (02/02/17 1710)  . tranexamic acid       LOS: 2 days    [name] Jaynie CollinsPrasad Sunny Aguon, MD [facility] Pager [phone]  If 7PM-7AM, please contact night-coverage [email or web address] Password [email or web address] 02/04/2017, 10:52 AM

## 2017-02-04 NOTE — Transfer of Care (Signed)
Immediate Anesthesia Transfer of Care Note  Patient: Tina Wheeler  Procedure(s) Performed: ARTHROPLASTY HEMIARTHROPLASTY LEFT hip (Left Hip)  Patient Location: PACU  Anesthesia Type:Spinal  Level of Consciousness: awake, alert  and oriented  Airway & Oxygen Therapy: Patient Spontanous Breathing and Patient connected to face mask  Post-op Assessment: Report given to RN and Post -op Vital signs reviewed and stable  Post vital signs: Reviewed and stable  Last Vitals:  Vitals:   02/04/17 0542 02/04/17 1005  BP: (!) 155/64 (!) 171/62  Pulse: (!) 107 100  Resp: 15 20  Temp: 36.8 C 37.9 C  SpO2: 93% 94%    Last Pain:  Vitals:   02/04/17 1005  TempSrc: Oral  PainSc:       Patients Stated Pain Goal: 3 (02/04/17 0721)  Complications: No apparent anesthesia complications

## 2017-02-04 NOTE — H&P (View-Only) (Signed)
     Subjective: Day of Surgery Procedure(s) (LRB): ARTHROPLASTY BIPOLAR HIP (HEMIARTHROPLASTY) VERSUS TOTAL LEFT (Left)    83 year old female with history of dementia, hypothyroidism, hypertension presented after a mechanical fall sustaining a left hip pain.  Patient was found to have left hip fracture.  Patient reports pain as mild, unless the leg moves.  It was significantly worse until treated with medication. Discussed surgery intervention and she does wish to proceed.  Risks, benefits and expectations were discussed with the patient.  Risks including but not limited to the risk of anesthesia, blood clots, nerve damage, blood vessel damage, failure of the prosthesis, infection and up to and including death.  Patient understand the risks, benefits and expectations and wishes to proceed with surgery.   Objective:   VITALS:   Vitals:   02/03/17 2157 02/04/17 0542  BP: (!) 147/64 (!) 155/64  Pulse: 97 (!) 107  Resp: 16 15  Temp: 98.7 F (37.1 C) 98.2 F (36.8 C)  SpO2: 95% 93%    Neurovascular intact Dorsiflexion/Plantar flexion intact  LABS Recent Labs    02/03/17 0530 02/04/17 0440  HGB 10.4* 9.7*  HCT 32.2* 30.4*  WBC 8.5 7.8  PLT 229 215    Recent Labs    02/03/17 0530 02/04/17 0440  NA 128* 125*  K 4.0 4.6  BUN 11 17  CREATININE 0.61 0.81  GLUCOSE 119* 119*     Assessment/Plan: Day of Surgery Procedure(s) (LRB): ARTHROPLASTY BIPOLAR HIP (HEMIARTHROPLASTY) VERSUS TOTAL LEFT (Left)  NPO now Plan on surgery today per Dr. Charlann Boxerlin.     Anastasio AuerbachMatthew S. Cayla Wiegand   PAC  02/04/2017, 8:31 AM

## 2017-02-05 ENCOUNTER — Encounter (HOSPITAL_COMMUNITY): Payer: Self-pay | Admitting: Orthopedic Surgery

## 2017-02-05 LAB — CBC
HCT: 29 % — ABNORMAL LOW (ref 36.0–46.0)
Hemoglobin: 9.4 g/dL — ABNORMAL LOW (ref 12.0–15.0)
MCH: 26.6 pg (ref 26.0–34.0)
MCHC: 32.4 g/dL (ref 30.0–36.0)
MCV: 81.9 fL (ref 78.0–100.0)
PLATELETS: 228 10*3/uL (ref 150–400)
RBC: 3.54 MIL/uL — ABNORMAL LOW (ref 3.87–5.11)
RDW: 16.4 % — AB (ref 11.5–15.5)
WBC: 7.9 10*3/uL (ref 4.0–10.5)

## 2017-02-05 LAB — BASIC METABOLIC PANEL
Anion gap: 7 (ref 5–15)
BUN: 15 mg/dL (ref 6–20)
CHLORIDE: 97 mmol/L — AB (ref 101–111)
CO2: 23 mmol/L (ref 22–32)
CREATININE: 0.6 mg/dL (ref 0.44–1.00)
Calcium: 9.3 mg/dL (ref 8.9–10.3)
GFR calc Af Amer: >60 mL/min (ref >60)
GFR calc non Af Amer: >60 mL/min (ref >60)
GLUCOSE: 124 mg/dL — AB (ref 65–99)
POTASSIUM: 4.9 mmol/L (ref 3.5–5.1)
Sodium: 127 mmol/L — ABNORMAL LOW (ref 135–145)

## 2017-02-05 LAB — RENAL FUNCTION PANEL
Albumin: 2.6 g/dL — ABNORMAL LOW (ref 3.5–5.0)
Anion gap: 5 (ref 5–15)
BUN: 15 mg/dL (ref 6–20)
CHLORIDE: 98 mmol/L — AB (ref 101–111)
CO2: 24 mmol/L (ref 22–32)
CREATININE: 0.59 mg/dL (ref 0.44–1.00)
Calcium: 9.2 mg/dL (ref 8.9–10.3)
GFR calc Af Amer: >60 mL/min (ref >60)
GLUCOSE: 124 mg/dL — AB (ref 65–99)
POTASSIUM: 4.9 mmol/L (ref 3.5–5.1)
Phosphorus: 3.1 mg/dL (ref 2.5–4.6)
Sodium: 127 mmol/L — ABNORMAL LOW (ref 135–145)

## 2017-02-05 MED ORDER — LIP MEDEX EX OINT
TOPICAL_OINTMENT | CUTANEOUS | Status: AC
  Administered 2017-02-06: 01:00:00
  Filled 2017-02-05: qty 7

## 2017-02-05 NOTE — Progress Notes (Signed)
PROGRESS NOTE    Tina Wheeler  ZOX:[id number] DOB: [birth year] DOA: 02/02/2017 PCP: System, Pcp Not In   Brief Narrative: 83 year old female with history of dementia, hypothyroidism, hypertension presented after a mechanical fall sustaining a left hip pain.  Patient was found to have left hip fracture.  [facility].  Plan for surgery on 11/18.  Assessment & Plan:   #Fall with left hip fracture/preoperative evaluation:  -s/p left hemiarthroplasty on 11/19.  Pain is controlled.  Plan for PT, OT evaluation.  Discussed with the patient regarding rehab.  Social worker consulted for possible skilled nursing home discharge. -Orthopedics consult appreciated. -On aspirin twice a day for DVT prophylaxis by orthopedics.  #Hypothyroidism: Continue Synthroid.  TSH level acceptable.  #Chronic hyponatremia: Sodium level around baseline.  Patient has chronic hyponatremia on reviewing her chart.  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  DVT prophylaxis: SCD 325 mg twice a day per orthopedics Code Status: Full code Family Communication: No family at bedside Disposition Plan: Likely skilled facility tomorrow    Consultants:   Orthopedics  Procedures: None Antimicrobials: None  Subjective: Seen and examined at bedside.  No chest pain, shortness of breath, nausea vomiting.  Denies hip pain. Objective: Vitals:   02/04/17 2047 02/05/17 0056 02/05/17 0441 02/05/17 0945  BP: (!) 131/58 94/63 (!) 151/56 (!) 121/46  Pulse: 89 78 87 89  Resp: 16 17 18 18   Temp: 98.1 F (36.7 C) 97.7 F (36.5 C) 98.2 F (36.8 C) 98.2 F (36.8 C)  TempSrc: Oral Oral Oral Oral  SpO2: 97% 97% 92% 94%  Weight:      Height:        Intake/Output Summary (Last 24 hours) at 02/05/2017 1111 Last data filed at 02/05/2017 [id number] Gross per 24 hour  Intake 1584 ml  Output 2250 ml  Net -666 ml   Filed Weights   02/02/17 1325  Weight: 87 kg (191 lb  12.8 oz)    Examination:  General exam: Lying in bed, not in distress Respiratory system: Clear bilateral, respiratory effort normal, no wheezing or crackle l Cardiovascular system: Regular rate rhythm, S1-S2 normal.  No pedal edema Gastrointestinal system: Abdomen soft, nontender.  Bowel sounds positive Central nervous system: Alert awake and following commands Extremities: No edema.  No external injuries. Skin: No rashes, lesions or ulcers     Data Reviewed: I have personally reviewed following labs and imaging studies  CBC: Recent Labs  Lab 02/03/17 0530 02/04/17 0440 02/05/17 0455  WBC 8.5 7.8 7.9  HGB 10.4* 9.7* 9.4*  HCT 32.2* 30.4* 29.0*  MCV 82.1 82.2 81.9  PLT 229 215 228   Basic Metabolic Panel: Recent Labs  Lab 02/03/17 0530 02/04/17 0440 02/05/17 0455  NA 128* 125* 127*  127*  K 4.0 4.6 4.9  4.9  CL 98* 95* 98*  97*  CO2 22 24 24  23   GLUCOSE 119* 119* 124*  124*  BUN 11 17 15  15   CREATININE 0.61 0.81 0.59  0.60  CALCIUM 9.2 8.8* 9.2  9.3  PHOS  --   --  3.1   GFR: Estimated Creatinine Clearance: 56.9 mL/min (by C-G formula based on SCr of 0.59 mg/dL). Liver Function Tests: Recent Labs  Lab 02/05/17 0455  ALBUMIN 2.6*   No results for input(s): LIPASE, AMYLASE in the last 168 hours. No results for input(s): AMMONIA in the last 168 hours. Coagulation Profile: No results for input(s): INR, PROTIME in the  last 168 hours. Cardiac Enzymes: No results for input(s): CKTOTAL, CKMB, CKMBINDEX, TROPONINI in the last 168 hours. BNP (last 3 results) No results for input(s): PROBNP in the last 8760 hours. HbA1C: No results for input(s): HGBA1C in the last 72 hours. CBG: No results for input(s): GLUCAP in the last 168 hours. Lipid Profile: No results for input(s): CHOL, HDL, LDLCALC, TRIG, CHOLHDL, LDLDIRECT in the last 72 hours. Thyroid Function Tests: Recent Labs    02/03/17 0530  TSH 1.892   Anemia Panel: Recent Labs     02/03/17 0530  VITAMINB12 441  FOLATE 26.0  FERRITIN 38  TIBC 381  IRON 22*  RETICCTPCT 2.4   Sepsis Labs: No results for input(s): PROCALCITON, LATICACIDVEN in the last 168 hours.  Recent Results (from the past 240 hour(s))  Surgical PCR screen     Status: Abnormal   Collection Time: 02/02/17  2:38 PM  Result Value Ref Range Status   MRSA, PCR POSITIVE (A) NEGATIVE Final    Comment: RESULT CALLED TO, READ BACK BY AND VERIFIED WITH: TAYLOR,J. RN @1727  ON 11.16.18 BY COHEN,K    Staphylococcus aureus POSITIVE (A) NEGATIVE Final    Comment: (NOTE) The Xpert SA Assay (FDA approved for NASAL specimens in patients 222 years of age and older), is one component of a comprehensive surveillance program. It is not intended to diagnose infection nor to guide or monitor treatment.          Radiology Studies: Pelvis Portable  Result Date: 02/04/2017 CLINICAL DATA:  Status post left hip replacement EXAM: PORTABLE PELVIS 1-2 VIEWS COMPARISON:  02/01/2017 FINDINGS: Remote changes of right hip replacement. New left hip replacement. Normal AP alignment. No hardware bony complicating feature. IMPRESSION: New left hip replacement.  No complicating feature. Electronically Signed   By: Charlett NoseKevin  Dover M.D.   On: 02/04/2017 13:59        Scheduled Meds: . aspirin EC  325 mg Oral BID  . Chlorhexidine Gluconate Cloth  6 each Topical Q0600  . docusate sodium  100 mg Oral BID  . ferrous sulfate  325 mg Oral TID PC  . levothyroxine  100 mcg Oral QAC breakfast  . mupirocin ointment  1 application Nasal BID  . polyethylene glycol  17 g Oral Daily   Continuous Infusions: . sodium chloride 30 mL/hr at 02/05/17 0451  . methocarbamol (ROBAXIN)  IV Stopped (02/04/17 1357)     LOS: 3 days    Dron Jaynie CollinsPrasad Bhandari, MD [facility] Pager [phone]  If 7PM-7AM, please contact night-coverage [email or web address] Password [email or web address] 02/05/2017, 11:11 AM

## 2017-02-05 NOTE — Progress Notes (Signed)
OT  Note  Patient Details Name: Arville LimeRosa L Baltzell MRN: [id number] DOB: [birth year]   Cancelled Treatment:    Reason Eval/Treat Not Completed: Other (comment)  Noted pt total A with PT and will need SNF- will defer OT eval to SNF Thanks, Jeanie SewerREDDING, Dorena BodoLorraine D  Lori Sole Lengacher, [place] [phone] 02/05/2017, 11:42 AM

## 2017-02-05 NOTE — Clinical Social Work Note (Signed)
Clinical Social Work Assessment  Patient Details  Name: Tina Wheeler MRN: [id number] Date of Birth: 19-Feb-1932  Date of referral:  02/05/17               Reason for consult:  Facility Placement                Permission sought to share information with:  Family Supports, Chartered certified accountant granted to share information::  Yes, Verbal Permission Granted  Name::        Agency::  [facility]  Relationship::  Daughter-Brenda  Contact Information:  [phone]  Housing/Transportation Living arrangements for the past 2 months:  Single Family Home Source of Information:  Patient, Adult Children Patient Interpreter Needed:  None Criminal Activity/Legal Involvement Pertinent to Current Situation/Hospitalization:  No - Comment as needed Significant Relationships:  Adult Children Lives with:  Adult Children Do you feel safe going back to the place where you live?  Yes Need for family participation in patient care:  Yes (Comment)  Care giving concerns:  Patient admitted for Left Hip Fracture. Patient is dependent with mobility. Patient and family prefer short rehab at [facility] before returning home.   Social Worker assessment / plan:  CSW met with patient at bedside, spoke with daughter by phone. Patient and daughter both agreeable to SNF placement at [facility] in [place]. The patient and family prefer a facility close to their home. The patient states prior to her hip fracture she was independent.   CSW explained SNF process.   CSW contacted admissions, confirmed bed is available at SNF, Authorization has been started. Authorization will take 48 hours.   Plan: Assist with discharge to SNF, Authorization pending at this time.     Employment status:  Retired Forensic scientist:  [employer] PT Recommendations:  Oval / Referral to community resources:  [place]  Patient/Family's Response to care:  Agreeable and Responding well to care.  She is disappointed she will miss thanksgiving at her with her children.   Patient/Family's Understanding of and Emotional Response to Diagnosis, Current Treatment, and Prognosis:  Patient worked with physical and feels made progress today per PT. She is hopeful to go to [place] rehab and return home to her family.  Emotional Assessment Appearance:  Appears younger than stated age Attitude/Demeanor/Rapport:    Affect (typically observed):  Accepting, Calm Orientation:  Oriented to Self, Oriented to Place, Oriented to  Time Alcohol / Substance use:  Not Applicable Psych involvement (Current and /or in the community):  No (Comment)  Discharge Needs  Concerns to be addressed:  Discharge Planning Concerns Readmission within the last 30 days:  Yes Current discharge risk:  Dependent with Mobility Barriers to Discharge:  Continued Medical Work up, [place], LCSW 02/05/2017, 1:24 PM

## 2017-02-05 NOTE — Progress Notes (Signed)
Patient ID: Tina Wheeler, female   DOB: 10-27-1931, 83 y.o.   MRN: [id number] Subjective: 1 Day Post-Op Procedure(s) (LRB): ARTHROPLASTY HEMIARTHROPLASTY LEFT hip (Left)    Patient reports pain as mild.  No events, comfortable eating breakfast this am  Objective:   VITALS:   Vitals:   02/05/17 0056 02/05/17 0441  BP: 94/63 (!) 151/56  Pulse: 78 87  Resp: 17 18  Temp: 97.7 F (36.5 C) 98.2 F (36.8 C)  SpO2: 97% 92%    Neurovascular intact Incision: dressing C/D/I  LABS Recent Labs    02/03/17 0530 02/04/17 0440 02/05/17 0455  HGB 10.4* 9.7* 9.4*  HCT 32.2* 30.4* 29.0*  WBC 8.5 7.8 7.9  PLT 229 215 228    Recent Labs    02/03/17 0530 02/04/17 0440 02/05/17 0455  NA 128* 125* 127*  127*  K 4.0 4.6 4.9  4.9  BUN 11 17 15  15   CREATININE 0.61 0.81 0.59  0.60  GLUCOSE 119* 119* 124*  124*    No results for input(s): LABPT, INR in the last 72 hours.   Assessment/Plan: 1 Day Post-Op Procedure(s) (LRB): ARTHROPLASTY HEMIARTHROPLASTY LEFT hip (Left)   Advance diet Up with therapy Discharge to SNF versus home (depending on progress and family support) when stable RTC in 2 weeks

## 2017-02-05 NOTE — Progress Notes (Signed)
Physical Therapy Treatment Patient Details Name: Tina Wheeler L Seab[place] MRN: [id number] DOB: 13-Sep-1931 Today's Date: 02/05/2017    History of Present Illness 83 year old female with history of dementia, hypothyroidism, hypertension presented after a mechanical fall sustaining a left hip pain.  Patient was found to have left hip fracture. S/P left hip hemiarthroplasty    PT Comments    Assisted patient back into bed with 2 assist. No family present to discuss DC plan.   Follow Up Recommendations  SNF     Equipment Recommendations  None recommended by PT    Recommendations for Other Services       Precautions / Restrictions Precautions Precautions: Fall;Posterior Hip Restrictions LLE Weight Bearing: Weight bearing as tolerated    Mobility  Bed Mobility Overal bed mobility: Needs Assistance Bed Mobility: Sit to Supine     Supine to sit: Max assist;+2 for physical assistance;+2 for safety/equipment Sit to supine: Total assist;+2 for physical assistance;+2 for safety/equipment   General bed mobility comments: assist with both legs and trunk  Transfers Overall transfer level: Needs assistance Equipment used: Rolling walker (2 wheeled) Transfers: Sit to/from BJ'sStand;Stand Pivot Transfers Sit to Stand: Max assist;+2 physical assistance;+2 safety/equipment Stand pivot transfers: +2 safety/equipment;+2 physical assistance       General transfer comment: small shuffle steps to turn to recliner  Ambulation/Gait                 Stairs            Wheelchair Mobility    Modified Rankin (Stroke Patients Only)       Balance Overall balance assessment: Needs assistance;History of Falls Sitting-balance support: Feet supported;Bilateral upper extremity supported Sitting balance-Leahy Scale: Poor   Postural control: Posterior lean                                  Cognition Arousal/Alertness: Awake/alert Behavior During Therapy: WFL for tasks  assessed/performed Overall Cognitive Status: No family/caregiver present to determine baseline cognitive functioning                                        Exercises      General Comments        Pertinent Vitals/Pain Pain Assessment: Faces Faces Pain Scale: Hurts whole lot Pain Location: left hip Pain Descriptors / Indicators: Discomfort Pain Intervention(s): Repositioned;Ice applied;Patient requesting pain meds-RN notified    Home Living Family/patient expects to be discharged to:: Unsure Living Arrangements: Children             Additional Comments: lives with daughter    Prior Function Level of Independence: Independent with assistive device(s)      Comments: uses a RW   PT Goals (current goals can now be found in the care plan section) Acute Rehab PT Goals Patient Stated Goal: none  stated PT Goal Formulation: With patient Time For Goal Achievement: 02/19/17 Potential to Achieve Goals: Good Progress towards PT goals: Progressing toward goals    Frequency    Min 3X/week      PT Plan Current plan remains appropriate    Co-evaluation              AM-PAC PT "6 Clicks" Daily Activity  Outcome Measure  Difficulty turning over in bed (including adjusting bedclothes, sheets and blankets)?: Unable Difficulty moving from lying on back to  sitting on the side of the bed? : Unable Difficulty sitting down on and standing up from a chair with arms (e.g., wheelchair, bedside commode, etc,.)?: Unable Help needed moving to and from a bed to chair (including a wheelchair)?: Total Help needed walking in hospital room?: Total Help needed climbing 3-5 steps with a railing? : Total 6 Click Score: 6    End of Session Equipment Utilized During Treatment: Gait belt Activity Tolerance: Patient tolerated treatment well Patient left: in bed;with call bell/phone within reach;with bed alarm set Nurse Communication: Mobility status PT Visit Diagnosis:  Difficulty in walking, not elsewhere classified (R26.2);Pain Pain - Right/Left: Left Pain - part of body: Hip     Time: 1155-1208 PT Time Calculation (min) (ACUTE ONLY): 13 min  Charges:  $Therapeutic Activity: 8-22 mins                    G CodesBlanchard Kelch:       Welford Christmas PT [phone]    Rada HayHill, Nassim Cosma Elizabeth 02/05/2017, 12:53 PM

## 2017-02-05 NOTE — NC FL2 (Addendum)
[place] MEDICAID FL2 LEVEL OF CARE SCREENING TOOL     IDENTIFICATION  Patient Name: Tina Wheeler Birthdate: 1932/02/19 Sex: female Admission Date (Current Location): 02/02/2017  [facility] [place]   Fac[facility] and Address:  [facility],  [address], [place] [id number]      Provider Number: [id number]  Attending Physician Name and Address:  Maxie BarbBhandari, Odyssey Vasbinder Prasad, MD  Relative Name and Phone Number:       Current Level of Care: Hospital Recommended Level of Care: Skilled Nursing Facility Prior Approval Number:    Date Approved/Denied:   PASRR Number:    Discharge Plan: Home    Current Diagnoses: Patient Active Problem List   Diagnosis Date Noted  . Hyponatremia   . Closed left hip fracture (HCC) 02/02/2017  . Essential hypertension 02/02/2017  . Hypothyroidism 02/02/2017  . Dementia 02/02/2017  . Anemia 05/02/2012  . Fracture of tibial plateau, closed 04/27/2012    Orientation RESPIRATION BLADDER Height & Weight     Self, Time, Situation, Place  Normal Continent Weight: 191 lb 12.8 oz (87 kg) Height:  5\' 4"  (162.6 cm)  BEHAVIORAL SYMPTOMS/MOOD NEUROLOGICAL BOWEL NUTRITION STATUS      Continent Diet(Regular)  AMBULATORY STATUS COMMUNICATION OF NEEDS Skin   Extensive Assist Verbally Normal(Incision Hip)                       Personal Care Assistance Level of Assistance  Bathing, Feeding, Dressing Bathing Assistance: Limited assistance Feeding assistance: Independent Dressing Assistance: Limited assistance     Functional Limitations Info  Sight, Hearing, Speech Sight Info: Impaired Hearing Info: Adequate Speech Info: Adequate    SPECIAL CARE FACTORS FREQUENCY  PT (By licensed PT), OT (By licensed OT)     PT Frequency: 5x/week OT Frequency: 5x/week            Contractures Contractures Info: Not present    Additional Factors Info  Code Status, Allergies Code Status Info: Fullcode Allergies Info:  No  Known Allergies           Current Medications (02/05/2017):  This is the current hospital active medication list Current Facility-Administered Medications  Medication Dose Route Frequency Provider Last Rate Last Dose  . 0.9 %  sodium chloride infusion   Intravenous Continuous Osvaldo ShipperKrishnan, Gokul, MD 30 mL/hr at 02/05/17 0451    . aspirin EC tablet 325 mg  325 mg Oral BID Lanney GinsBabish, Matthew, PA-C   325 mg at 02/05/17 [id number]  . bisacodyl (DULCOLAX) suppository 10 mg  10 mg Rectal Daily PRN Lanney GinsBabish, Matthew, PA-C      . Chlorhexidine Gluconate Cloth 2 % PADS 6 each  6 each Topical Q0600 Maxie BarbBhandari, Jewelz Kobus Prasad, MD   6 each at 02/04/17 0636  . docusate sodium (COLACE) capsule 100 mg  100 mg Oral BID Osvaldo ShipperKrishnan, Gokul, MD   100 mg at 02/05/17 0854  . ferrous sulfate tablet 325 mg  325 mg Oral TID PC Lanney GinsBabish, Matthew, PA-C   325 mg at 02/05/17 1224  . HYDROcodone-acetaminophen (NORCO/VICODIN) 5-325 MG per tablet 1-2 tablet  1-2 tablet Oral Q6H PRN Osvaldo ShipperKrishnan, Gokul, MD   2 tablet at 02/05/17 1224  . levothyroxine (SYNTHROID, LEVOTHROID) tablet 100 mcg  100 mcg Oral QAC breakfast Maxie BarbBhandari, Leanore Biggers Prasad, MD   100 mcg at 02/05/17 0853  . magnesium citrate solution 1 Bottle  1 Bottle Oral Once PRN Lanney GinsBabish, Matthew, PA-C      . menthol-cetylpyridinium (CEPACOL) lozenge 3 mg  1 lozenge Oral PRN Lanney GinsBabish, Matthew, PA-C       Or  . phenol (CHLORASEPTIC) mouth spray 1 spray  1 spray Mouth/Throat PRN Babish, Molli HazardMatthew, PA-C      . methocarbamol (ROBAXIN) tablet 500 mg  500 mg Oral Q6H PRN Osvaldo ShipperKrishnan, Gokul, MD   500 mg at 02/04/17 2022   Or  . methocarbamol (ROBAXIN) 500 mg in dextrose 5 % 50 mL IVPB  500 mg Intravenous Q6H PRN Osvaldo ShipperKrishnan, Gokul, MD   Stopped at 02/04/17 1357  . metoCLOPramide (REGLAN) tablet 5-10 mg  5-10 mg Oral Q8H PRN Lanney GinsBabish, Matthew, PA-C       Or  . metoCLOPramide (REGLAN) injection 5-10 mg  5-10 mg Intravenous Q8H PRN Lanney GinsBabish, Matthew, PA-C      . morphine 4 MG/ML injection 0.52 mg  0.52 mg Intravenous Q2H  PRN Osvaldo ShipperKrishnan, Gokul, MD   0.52 mg at 02/04/17 2022  . mupirocin ointment (BACTROBAN) 2 % 1 application  1 application Nasal BID Maxie BarbBhandari, Analicia Skibinski Prasad, MD   1 application at 02/05/17 [phone]  . ondansetron (ZOFRAN) tablet 4 mg  4 mg Oral Q6H PRN Lanney GinsBabish, Matthew, PA-C       Or  . ondansetron (ZOFRAN) injection 4 mg  4 mg Intravenous Q6H PRN Babish, Matthew, PA-C      . polyethylene glycol (MIRALAX / GLYCOLAX) packet 17 g  17 g Oral Daily Osvaldo ShipperKrishnan, Gokul, MD   17 g at 02/05/17 0854  . polyethylene glycol (MIRALAX / GLYCOLAX) packet 17 g  17 g Oral Daily PRN Lanney GinsBabish, Matthew, PA-C         Discharge Medications: Please see discharge summary for a list of discharge medications.  Relevant Imaging Results:  Relevant Lab Results:   Additional Information ssn: [phone]  Clearance CootsNicole A Sinclair, LCSW

## 2017-02-05 NOTE — Evaluation (Signed)
Physical Therapy Evaluation Patient Details Name: Arville LimeRosa L Busche MRN: [id number] DOB: Nov 28, 1931 Today's Date: 02/05/2017   History of Present Illness  83 year old female with history of dementia, hypothyroidism, hypertension presented after a mechanical fall sustaining a left hip pain.  Patient was found to have left hip fracture. S/P left hip hemiarthroplasty  Clinical Impression  The patient requires 2 assist for stand and pivot to recliner using RW. No family present to discuss DC plan. Pt admitted with above diagnosis. Pt currently with functional limitations due to the deficits listed below (see PT Problem List).  Pt will benefit from skilled PT to increase their independence and safety with mobility to allow discharge to the venue listed below.       Follow Up Recommendations SNF    Equipment Recommendations  None recommended by PT    Recommendations for Other Services       Precautions / Restrictions Precautions Precautions: Fall;Posterior Hip Restrictions LLE Weight Bearing: Weight bearing as tolerated      Mobility  Bed Mobility Overal bed mobility: Needs Assistance Bed Mobility: Supine to Sit     Supine to sit: Max assist;+2 for physical assistance;+2 for safety/equipment     General bed mobility comments: assist with left leg and trunk  Transfers Overall transfer level: Needs assistance Equipment used: Rolling walker (2 wheeled) Transfers: Sit to/from UGI CorporationStand;Stand Pivot Transfers Sit to Stand: Max assist;+2 physical assistance;+2 safety/equipment Stand pivot transfers: +2 safety/equipment;+2 physical assistance       General transfer comment: small shuffle steps to turn to recliner  Ambulation/Gait                Stairs            Wheelchair Mobility    Modified Rankin (Stroke Patients Only)       Balance Overall balance assessment: Needs assistance;History of Falls Sitting-balance support: Feet supported;Bilateral upper extremity  supported Sitting balance-Leahy Scale: Poor   Postural control: Posterior lean                                   Pertinent Vitals/Pain Pain Assessment: Faces Faces Pain Scale: Hurts little more Pain Location: left hip Pain Descriptors / Indicators: Discomfort Pain Intervention(s): Premedicated before session;Repositioned;Ice applied;Monitored during session    Home Living Family/patient expects to be discharged to:: Unsure Living Arrangements: Children               Additional Comments: lives with daughter    Prior Function Level of Independence: Independent with assistive device(s)         Comments: uses a RW     Higher education careers adviserHand Dominance        Extremity/Trunk Assessment   Upper Extremity Assessment Upper Extremity Assessment: Generalized weakness    Lower Extremity Assessment Lower Extremity Assessment: LLE deficits/detail LLE Deficits / Details: decreased weight on the left leg when standing    Cervical / Trunk Assessment Cervical / Trunk Assessment: Normal  Communication   Communication: No difficulties  Cognition Arousal/Alertness: Awake/alert Behavior During Therapy: WFL for tasks assessed/performed Overall Cognitive Status: No family/caregiver present to determine baseline cognitive functioning                                        General Comments      Exercises     Assessment/Plan  PT Assessment Patient needs continued PT services  PT Problem List Decreased strength;Decreased range of motion;Decreased activity tolerance;Decreased balance;Decreased mobility;Decreased coordination;Pain;Decreased knowledge of precautions;Decreased safety awareness;Decreased knowledge of use of DME       PT Treatment Interventions DME instruction;Gait training;Functional mobility training;Therapeutic activities;Patient/family education    PT Goals (Current goals can be found in the Care Plan section)  Acute Rehab PT Goals Patient  Stated Goal: none  stated PT Goal Formulation: With patient Time For Goal Achievement: 02/19/17 Potential to Achieve Goals: Good    Frequency Min 3X/week   Barriers to discharge Decreased caregiver support      Co-evaluation               AM-PAC PT "6 Clicks" Daily Activity  Outcome Measure Difficulty turning over in bed (including adjusting bedclothes, sheets and blankets)?: Unable Difficulty moving from lying on back to sitting on the side of the bed? : Unable Difficulty sitting down on and standing up from a chair with arms (e.g., wheelchair, bedside commode, etc,.)?: Unable Help needed moving to and from a bed to chair (including a wheelchair)?: Total Help needed walking in hospital room?: Total Help needed climbing 3-5 steps with a railing? : Total 6 Click Score: 6    End of Session Equipment Utilized During Treatment: Gait belt Activity Tolerance: Patient tolerated treatment well Patient left: in chair;with call bell/phone within reach;with chair alarm set Nurse Communication: Mobility status PT Visit Diagnosis: Difficulty in walking, not elsewhere classified (R26.2);Pain Pain - Right/Left: Left Pain - part of body: Hip    Time: 0951-1007 PT Time Calculation (min) (ACUTE ONLY): 16 min   Charges:   PT Evaluation $PT Eval Low Complexity: 1 Low     PT G CodesBlanchard Kelch:        Quadir Muns PT [phone]   Rada HayHill, Carmela Piechowski Elizabeth 02/05/2017, 11:39 AM

## 2017-02-06 MED ORDER — HYDROCODONE-ACETAMINOPHEN 5-325 MG PO TABS: 1.0000 | ORAL_TABLET | Freq: Four times a day (QID) | ORAL | 0 refills | Status: DC | PRN

## 2017-02-06 MED ORDER — FERROUS SULFATE 325 (65 FE) MG PO TABS: 325.0000 mg | ORAL_TABLET | Freq: Three times a day (TID) | ORAL | 3 refills | Status: DC

## 2017-02-06 MED ORDER — ASPIRIN 81 MG PO CHEW: 81.0000 mg | CHEWABLE_TABLET | Freq: Two times a day (BID) | ORAL | 0 refills | Status: AC

## 2017-02-06 MED ORDER — METHOCARBAMOL 500 MG PO TABS: 500.0000 mg | ORAL_TABLET | Freq: Four times a day (QID) | ORAL | 0 refills | Status: DC | PRN

## 2017-02-06 NOTE — Progress Notes (Signed)
Patient ID: Tina Wheeler, female   DOB: 1932-01-06, 83 y.o.   MRN: [id number] Subjective: 2 Days Post-Op Procedure(s) (LRB): ARTHROPLASTY HEMIARTHROPLASTY LEFT hip (Left)    Patient reports pain as moderate.  Up a little with therapy yesterday to chair and back to bed. No events.  Relatively comfortable.  Limited activity pre-op thus pre-op deconditioning effecting recoverability  Objective:   VITALS:   Vitals:   02/05/17 2049 02/06/17 0505  BP: (!) 135/58 (!) 117/98  Pulse: 92 100  Resp: 15 19  Temp: 97.7 F (36.5 C) 98.9 F (37.2 C)  SpO2: 95% 93%    Neurovascular intact Incision: dressing C/D/I  LABS Recent Labs    02/04/17 0440 02/05/17 0455  HGB 9.7* 9.4*  HCT 30.4* 29.0*  WBC 7.8 7.9  PLT 215 228    Recent Labs    02/04/17 0440 02/05/17 0455  NA 125* 127*  127*  K 4.6 4.9  4.9  BUN 17 15  15   CREATININE 0.81 0.59  0.60  GLUCOSE 119* 124*  124*    No results for input(s): LABPT, INR in the last 72 hours.   Assessment/Plan: 2 Days Post-Op Procedure(s) (LRB): ARTHROPLASTY HEMIARTHROPLASTY LEFT hip (Left)   Up with therapy Discharge to SNF when medically stable RTC in 2 weeks DVT proph for 4 weeks WBAt LLE

## 2017-02-06 NOTE — Discharge Summary (Signed)
Physician Discharge Summary  Tina Wheeler:[id number] DOB: 28-Jan-1932 DOA: 02/02/2017  PCP: System, Pcp Not In  Admit date: 02/02/2017 Discharge date: 02/06/2017  Admitted From:home Disposition:SNF  Recommendations for Outpatient Follow-up:  1. Follow up with PCP and Orthopedics in 1-2 weeks 2. Please obtain BMP/CBC in one week   Home Health:SNF Equipment/Devices:none Discharge Condition:stable CODE STATUS:full code Diet recommendation:regular  Brief/Interim Summary: 83 year old female with history of dementia, hypothyroidism, hypertension presented after a mechanical fall sustaining a left hip pain.  Patient was found to have left hip fracture.  Patient was transferred to [facility] for surgical intervention.  #Fall with left hip fracture/preoperative evaluation:  -s/p left hemiarthroplasty on 11/19.  PT, OT evaluated the patient and transfer her care to SNF.   -On aspirin twice a day for DVT prophylaxis by orthopedics. -pain is controlled.  #Hypothyroidism: Continue Synthroid.  TSH level acceptable.  #Chronic hyponatremia: Sodium level around baseline.  Patient has chronic hyponatremia on reviewing her chart.  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  Patient is clinically stable.  Denies pain.  Continue current pain management, stool softener aspirin and follow-up with PCP and orthopedics.  I discussed with the patient and she verbalized understanding.  Also discussed with the Child psychotherapist.  Discharge Diagnoses:  Principal Problem:   Closed left hip fracture [facility]) Active Problems:   Essential hypertension   Hypothyroidism   Dementia   Hyponatremia    Discharge Instructions  Discharge Instructions    Call MD for:  difficulty breathing, headache or visual disturbances   Complete by:  As directed    Call MD for:  extreme fatigue   Complete by:  As directed    Call MD for:  hives   Complete by:  As directed    Call MD for:  persistant dizziness  or light-headedness   Complete by:  As directed    Call MD for:  persistant nausea and vomiting   Complete by:  As directed    Call MD for:  severe uncontrolled pain   Complete by:  As directed    Call MD for:  temperature >100.4   Complete by:  As directed    Diet general   Complete by:  As directed    Increase activity slowly   Complete by:  As directed      Allergies as of 02/06/2017   No Known Allergies     Medication List    STOP taking these medications   acetaminophen 500 MG tablet Commonly known as:  TYLENOL   alum & mag hydroxide-simeth 200-200-20 MG/5ML suspension Commonly known as:  MAALOX/MYLANTA   cyclobenzaprine 10 MG tablet Commonly known as:  FLEXERIL   enoxaparin 40 MG/0.4ML injection Commonly known as:  LOVENOX   oxyCODONE 5 MG immediate release tablet Commonly known as:  Oxy IR/ROXICODONE   traMADol 50 MG tablet Commonly known as:  ULTRAM     TAKE these medications   aspirin 81 MG chewable tablet Commonly known as:  ASPIRIN CHILDRENS Chew 1 tablet (81 mg total) 2 (two) times daily by mouth.   bisacodyl 10 MG suppository Commonly known as:  DULCOLAX Place 1 suppository (10 mg total) rectally daily as needed.   DSS 100 MG Caps Take 100 mg by mouth 2 (two) times daily.   ferrous sulfate 325 (65 FE) MG tablet Take 1 tablet (325 mg total) 3 (three) times daily after meals by mouth. What changed:  when to take this   furosemide 20 MG tablet  Commonly known as:  LASIX TAKE 1 TABLET BY MOUTH EVERY DAY AS NEEDED FOR EDEMA   HYDROcodone-acetaminophen 5-325 MG tablet Commonly known as:  NORCO/VICODIN Take 1-2 tablets every 6 (six) hours as needed by mouth for moderate pain.   hydroxypropyl methylcellulose / hypromellose 2.5 % ophthalmic solution Commonly known as:  ISOPTO TEARS / GONIOVISC Place 1 drop 3 (three) times daily as needed into the left eye for dry eyes.   KLOR-CON 10 10 MEQ tablet Generic drug:  potassium chloride TAKE 1 TABLET  BY MOUTH EVERY DAY WITH LASIX   levothyroxine 100 MCG tablet Commonly known as:  SYNTHROID, LEVOTHROID Take 100 mcg daily by mouth.   lisinopril 20 MG tablet Commonly known as:  PRINIVIL,ZESTRIL Take 20 mg daily by mouth.   methocarbamol 500 MG tablet Commonly known as:  ROBAXIN Take 1 tablet (500 mg total) every 6 (six) hours as needed by mouth for muscle spasms. What changed:  reasons to take this   ondansetron 4 MG tablet Commonly known as:  ZOFRAN Take 1 tablet (4 mg total) by mouth every 6 (six) hours as needed for nausea.   oxybutynin 5 MG tablet Commonly known as:  DITROPAN TAKE 1 TAB BY MOUTH EVERY DAY   polyethylene glycol packet Commonly known as:  MIRALAX / GLYCOLAX Take 17 g by mouth daily as needed.   prednisoLONE acetate 0.12 % ophthalmic suspension Commonly known as:  PRED MILD Place 1 drop into the right eye 2 (two) times daily. What changed:    when to take this  reasons to take this  additional instructions       Contact information for follow-up providers    Durene Romans, MD. Schedule an appointment as soon as possible for a visit in 2 week(s).   Specialty:  Orthopedic Surgery Contact information: [address] [place] [address] [phone]            Contact information for after-discharge care    Destination    [facility] Follow up.   Service:  Skilled Nursing Contact information: [address] [place] [address] [phone]                 No Known Allergies  Consultations: Orthopedics  Procedures/Studies: Hip surgery  Subjective: Seen and examined at bedside.  Denies headache, dizziness, nausea, vomiting, chest pain, shortness of breath.  The pain is controlled with the current medication.  Discharge Exam: Vitals:   02/05/17 2049 02/06/17 0505  BP: (!) 135/58 (!) 117/98  Pulse: 92 100  Resp: 15 19  Temp: 97.7 F (36.5 C) 98.9 F (37.2 C)  SpO2: 95%  93%   Vitals:   02/05/17 1347 02/05/17 2049 02/06/17 0505 02/06/17 0635  BP: (!) 129/49 (!) 135/58 (!) 117/98   Pulse: 95 92 100   Resp: 18 15 19    Temp: 98.1 F (36.7 C) 97.7 F (36.5 C) 98.9 F (37.2 C)   TempSrc: Oral Oral Oral   SpO2: 94% 95% 93%   Weight:    85.8 kg (189 lb 2.5 oz)  Height:        General: Pt is alert, awake, not in acute distress Cardiovascular: RRR, S1/S2 +, no rubs, no gallops Respiratory: CTA bilaterally, no wheezing, no rhonchi Abdominal: Soft, NT, ND, bowel sounds + Extremities: no edema, no cyanosis    The results of significant diagnostics from this hospitalization (including imaging, microbiology, ancillary and laboratory) are listed below for reference.  Microbiology: Recent Results (from the past 240 hour(s))  Surgical PCR screen     Status: Abnormal   Collection Time: 02/02/17  2:38 PM  Result Value Ref Range Status   MRSA, PCR POSITIVE (A) NEGATIVE Final    Comment: RESULT CALLED TO, READ BACK BY AND VERIFIED WITH: TAYLOR,J. RN @1727  ON 11.16.18 BY COHEN,K    Staphylococcus aureus POSITIVE (A) NEGATIVE Final    Comment: (NOTE) The Xpert SA Assay (FDA approved for NASAL specimens in patients 81 years of age and older), is one component of a comprehensive surveillance program. It is not intended to diagnose infection nor to guide or monitor treatment.      Labs: BNP (last 3 results) No results for input(s): BNP in the last 8760 hours. Basic Metabolic Panel: Recent Labs  Lab 02/03/17 0530 02/04/17 0440 02/05/17 0455  NA 128* 125* 127*  127*  K 4.0 4.6 4.9  4.9  CL 98* 95* 98*  97*  CO2 22 24 24  23   GLUCOSE 119* 119* 124*  124*  BUN 11 17 15  15   CREATININE 0.61 0.81 0.59  0.60  CALCIUM 9.2 8.8* 9.2  9.3  PHOS  --   --  3.1   Liver Function Tests: Recent Labs  Lab 02/05/17 0455  ALBUMIN 2.6*   No results for input(s): LIPASE, AMYLASE in the last 168 hours. No results for input(s): AMMONIA in the last  168 hours. CBC: Recent Labs  Lab 02/03/17 0530 02/04/17 0440 02/05/17 0455  WBC 8.5 7.8 7.9  HGB 10.4* 9.7* 9.4*  HCT 32.2* 30.4* 29.0*  MCV 82.1 82.2 81.9  PLT 229 215 228   Cardiac Enzymes: No results for input(s): CKTOTAL, CKMB, CKMBINDEX, TROPONINI in the last 168 hours. BNP: Invalid input(s): POCBNP CBG: No results for input(s): GLUCAP in the last 168 hours. D-Dimer No results for input(s): DDIMER in the last 72 hours. Hgb A1c No results for input(s): HGBA1C in the last 72 hours. Lipid Profile No results for input(s): CHOL, HDL, LDLCALC, TRIG, CHOLHDL, LDLDIRECT in the last 72 hours. Thyroid function studies No results for input(s): TSH, T4TOTAL, T3FREE, THYROIDAB in the last 72 hours.  Invalid input(s): FREET3 Anemia work up No results for input(s): VITAMINB12, FOLATE, FERRITIN, TIBC, IRON, RETICCTPCT in the last 72 hours. Urinalysis    Component Value Date/Time   COLORURINE YELLOW 07/24/2007 0357   APPEARANCEUR CLEAR 07/24/2007 0357   LABSPEC 1.023 07/24/2007 0357   PHURINE 5.0 07/24/2007 0357   GLUCOSEU NEGATIVE 07/24/2007 0357   HGBUR NEGATIVE 07/24/2007 0357   BILIRUBINUR NEGATIVE 07/24/2007 0357   KETONESUR NEGATIVE 07/24/2007 0357   PROTEINUR NEGATIVE 07/24/2007 0357   UROBILINOGEN 0.2 07/24/2007 0357   NITRITE NEGATIVE 07/24/2007 0357   LEUKOCYTESUR  07/24/2007 0357    NEGATIVE MICROSCOPIC NOT DONE ON URINES WITH NEGATIVE PROTEIN, BLOOD, LEUKOCYTES, NITRITE, OR GLUCOSE <1000 mg/dL.   Sepsis Labs Invalid input(s): PROCALCITONIN,  WBC,  LACTICIDVEN Microbiology Recent Results (from the past 240 hour(s))  Surgical PCR screen     Status: Abnormal   Collection Time: 02/02/17  2:38 PM  Result Value Ref Range Status   MRSA, PCR POSITIVE (A) NEGATIVE Final    Comment: RESULT CALLED TO, READ BACK BY AND VERIFIED WITH: TAYLOR,J. RN @1727  ON 11.16.18 BY COHEN,K    Staphylococcus aureus POSITIVE (A) NEGATIVE Final    Comment: (NOTE) The Xpert SA Assay  (FDA approved for NASAL specimens in patients 81 years of age and older), is one component of a  comprehensive surveillance program. It is not intended to diagnose infection nor to guide or monitor treatment.      Time coordinating discharge: 27 minutes  SIGNED:   Maxie Barbron Prasad Jorge Retz, MD  [facility] 02/06/2017, 9:54 AM  If 7PM-7AM, please contact night-coverage [email or web address] Password [email or web address]

## 2017-02-06 NOTE — Clinical Social Work Placement (Addendum)
Authorization Received. CSW informed patient daughter, she plans to meet pt. at the facility.  Patient called for transportation, will arrive between 3:00-4:30pm(Daughter informed) Med. Nes. Complete. Nurse given number for report.  CLINICAL SOCIAL WORK PLACEMENT  NOTE  Date:  02/06/2017  Patient Details  Name: Tina Wheeler MRN: [id number] Date of Birth: 11/06/1931  Clinical Social Work is seeking post-discharge placement for this patient at the Skilled  Nursing Facility level of care (*CSW will initial, date and re-position this form in  chart as items are completed):  Yes   Patient/family provided with [facility] Clinical Social Work Department's list of facilities offering this level of care within the geographic area requested by the patient (or if unable, by the patient's family).  Yes   Patient/family informed of their freedom to choose among providers that offer the needed level of care, that participate in Medicare, Medicaid or managed care program needed by the patient, have an available bed and are willing to accept the patient.  Yes   Patient/family informed of [facility]'s ownership interest in [facility] and [facility], as well as of the fact that they are under no obligation to receive care at these facilities.  PASRR submitted to EDS on       PASRR number received on       Existing PASRR number confirmed on ([place] Resident)     FL2 transmitted to all facilities in geographic area requested by pt/family on       FL2 transmitted to all facilities within larger geographic area on       Patient informed that his/her managed care company has contracts with or will negotiate with certain facilities, including the following:  [facility] and [facility]     Yes   Patient/family informed of bed offers received.  [facility]     Physician recommends and patient chooses bed at      Patient to [facility]  [facility] 02/06/17.  Patient to be transferred to facility by [facility]     Patient family notified on 02/06/17 of transfer.  Name of family member notified:  Daughter      PHYSICIAN       Additional Comment:    _______________________________________________ Clearance CootsNicole A Jules Vidovich, LCSW 02/06/2017, 10:23 AM

## 2017-02-06 NOTE — Progress Notes (Signed)
Report called to nurse at the facility. Patient is stable at discharge.

## 2018-04-14 ENCOUNTER — Observation Stay (HOSPITAL_COMMUNITY)
Admission: EM | Admit: 2018-04-14 | Discharge: 2018-04-20 | Disposition: A | Discharge status: Home / Self Care | Payer: Medicare HMO | Attending: Internal Medicine | Admitting: Internal Medicine

## 2018-04-14 ENCOUNTER — Emergency Department (HOSPITAL_COMMUNITY): Payer: Medicare HMO

## 2018-04-14 ENCOUNTER — Other Ambulatory Visit: Payer: Self-pay

## 2018-04-14 ENCOUNTER — Encounter (HOSPITAL_COMMUNITY): Payer: Self-pay

## 2018-04-14 DIAGNOSIS — R627 Adult failure to thrive: Secondary | ICD-10-CM | POA: Diagnosis present

## 2018-04-14 DIAGNOSIS — H3530 Unspecified macular degeneration: Secondary | ICD-10-CM | POA: Diagnosis not present

## 2018-04-14 DIAGNOSIS — Z79890 Hormone replacement therapy (postmenopausal): Secondary | ICD-10-CM | POA: Insufficient documentation

## 2018-04-14 DIAGNOSIS — R45851 Suicidal ideations: Principal | ICD-10-CM | POA: Insufficient documentation

## 2018-04-14 DIAGNOSIS — S9031XA Contusion of right foot, initial encounter: Secondary | ICD-10-CM | POA: Diagnosis not present

## 2018-04-14 DIAGNOSIS — Z66 Do not resuscitate: Secondary | ICD-10-CM | POA: Diagnosis not present

## 2018-04-14 DIAGNOSIS — F32A Depression, unspecified: Secondary | ICD-10-CM

## 2018-04-14 DIAGNOSIS — E039 Hypothyroidism, unspecified: Secondary | ICD-10-CM | POA: Insufficient documentation

## 2018-04-14 DIAGNOSIS — F332 Major depressive disorder, recurrent severe without psychotic features: Secondary | ICD-10-CM | POA: Diagnosis not present

## 2018-04-14 DIAGNOSIS — T1491XA Suicide attempt, initial encounter: Secondary | ICD-10-CM | POA: Diagnosis not present

## 2018-04-14 DIAGNOSIS — R2681 Unsteadiness on feet: Secondary | ICD-10-CM

## 2018-04-14 DIAGNOSIS — T391X2A Poisoning by 4-Aminophenol derivatives, intentional self-harm, initial encounter: Secondary | ICD-10-CM | POA: Diagnosis not present

## 2018-04-14 DIAGNOSIS — W19XXXA Unspecified fall, initial encounter: Secondary | ICD-10-CM | POA: Insufficient documentation

## 2018-04-14 DIAGNOSIS — I10 Essential (primary) hypertension: Secondary | ICD-10-CM | POA: Insufficient documentation

## 2018-04-14 DIAGNOSIS — B9620 Unspecified Escherichia coli [E. coli] as the cause of diseases classified elsewhere: Secondary | ICD-10-CM | POA: Insufficient documentation

## 2018-04-14 DIAGNOSIS — E871 Hypo-osmolality and hyponatremia: Secondary | ICD-10-CM | POA: Diagnosis not present

## 2018-04-14 DIAGNOSIS — F329 Major depressive disorder, single episode, unspecified: Secondary | ICD-10-CM

## 2018-04-14 DIAGNOSIS — N390 Urinary tract infection, site not specified: Secondary | ICD-10-CM | POA: Diagnosis not present

## 2018-04-14 DIAGNOSIS — Z79899 Other long term (current) drug therapy: Secondary | ICD-10-CM | POA: Diagnosis not present

## 2018-04-14 DIAGNOSIS — D649 Anemia, unspecified: Secondary | ICD-10-CM | POA: Diagnosis not present

## 2018-04-14 DIAGNOSIS — R600 Localized edema: Secondary | ICD-10-CM | POA: Diagnosis not present

## 2018-04-14 DIAGNOSIS — H3321 Serous retinal detachment, right eye: Secondary | ICD-10-CM | POA: Insufficient documentation

## 2018-04-14 DIAGNOSIS — H33021 Retinal detachment with multiple breaks, right eye: Secondary | ICD-10-CM | POA: Insufficient documentation

## 2018-04-14 DIAGNOSIS — H353223 Exudative age-related macular degeneration, left eye, with inactive scar: Secondary | ICD-10-CM | POA: Insufficient documentation

## 2018-04-14 LAB — RAPID URINE DRUG SCREEN, HOSP PERFORMED
Amphetamines: NOT DETECTED
BENZODIAZEPINES: NOT DETECTED
Barbiturates: NOT DETECTED
Cocaine: NOT DETECTED
Opiates: NOT DETECTED
Tetrahydrocannabinol: NOT DETECTED

## 2018-04-14 LAB — COMPREHENSIVE METABOLIC PANEL
ALT: 22 U/L (ref 0–44)
AST: 43 U/L — AB (ref 15–41)
Albumin: 2.8 g/dL — ABNORMAL LOW (ref 3.5–5.0)
Alkaline Phosphatase: 124 U/L (ref 38–126)
Anion gap: 9 (ref 5–15)
BILIRUBIN TOTAL: 0.6 mg/dL (ref 0.3–1.2)
BUN: 14 mg/dL (ref 8–23)
CHLORIDE: 92 mmol/L — AB (ref 98–111)
CO2: 24 mmol/L (ref 22–32)
CREATININE: 0.68 mg/dL (ref 0.44–1.00)
Calcium: 9.3 mg/dL (ref 8.9–10.3)
GFR calc Af Amer: >60 mL/min (ref >60)
Glucose, Bld: 117 mg/dL — ABNORMAL HIGH (ref 70–99)
Potassium: 4.4 mmol/L (ref 3.5–5.1)
Sodium: 125 mmol/L — ABNORMAL LOW (ref 135–145)
TOTAL PROTEIN: 5.5 g/dL — AB (ref 6.5–8.1)

## 2018-04-14 LAB — URINALYSIS, ROUTINE W REFLEX MICROSCOPIC
BILIRUBIN URINE: NEGATIVE
Glucose, UA: NEGATIVE mg/dL
HGB URINE DIPSTICK: NEGATIVE
KETONES UR: 5 mg/dL — AB
Nitrite: NEGATIVE
Protein, ur: NEGATIVE mg/dL
SPECIFIC GRAVITY, URINE: 1.014 (ref 1.005–1.030)
pH: 6 (ref 5.0–8.0)

## 2018-04-14 LAB — CBC
HEMATOCRIT: 34.4 % — AB (ref 36.0–46.0)
Hemoglobin: 11.5 g/dL — ABNORMAL LOW (ref 12.0–15.0)
MCH: 29.7 pg (ref 26.0–34.0)
MCHC: 33.4 g/dL (ref 30.0–36.0)
MCV: 88.9 fL (ref 80.0–100.0)
NRBC: 0 % (ref 0.0–0.2)
Platelets: 333 10*3/uL (ref 150–400)
RBC: 3.87 MIL/uL (ref 3.87–5.11)
RDW: 14.3 % (ref 11.5–15.5)
WBC: 8.1 10*3/uL (ref 4.0–10.5)

## 2018-04-14 LAB — SALICYLATE LEVEL: Salicylate Lvl: <7 mg/dL (ref 2.8–30.0)

## 2018-04-14 LAB — LIPASE, BLOOD: LIPASE: 35 U/L (ref 11–51)

## 2018-04-14 LAB — TSH: TSH: 5.844 u[IU]/mL — ABNORMAL HIGH (ref 0.350–4.500)

## 2018-04-14 LAB — ETHANOL

## 2018-04-14 LAB — ACETAMINOPHEN LEVEL: Acetaminophen (Tylenol), Serum: <10 ug/mL — ABNORMAL LOW (ref 10–30)

## 2018-04-14 LAB — TROPONIN I: Troponin I: <0.03 ng/mL (ref <0.03)

## 2018-04-14 MED ORDER — ENSURE ENLIVE PO LIQD
237.0000 mL | ORAL | Status: DC
  Administered 2018-04-15 – 2018-04-16 (×2): 237 mL via ORAL

## 2018-04-14 MED ORDER — ACETAMINOPHEN 650 MG RE SUPP: 650.0000 mg | Freq: Four times a day (QID) | RECTAL | Status: DC | PRN

## 2018-04-14 MED ORDER — SODIUM CHLORIDE 0.9 % IV SOLN
1.0000 g | Freq: Once | INTRAVENOUS | Status: DC
  Filled 2018-04-14: qty 10

## 2018-04-14 MED ORDER — SODIUM CHLORIDE 0.9 % IV SOLN
1.0000 g | Freq: Every day | INTRAVENOUS | Status: DC
  Administered 2018-04-14 – 2018-04-16 (×3): 1 g via INTRAVENOUS
  Filled 2018-04-14 (×2): qty 1

## 2018-04-14 MED ORDER — SODIUM CHLORIDE 0.9 % IV BOLUS
1000.0000 mL | Freq: Once | INTRAVENOUS | Status: AC
  Administered 2018-04-14: 1000 mL via INTRAVENOUS

## 2018-04-14 MED ORDER — ACETAMINOPHEN 325 MG PO TABS: 650.0000 mg | ORAL_TABLET | Freq: Four times a day (QID) | ORAL | Status: DC | PRN

## 2018-04-14 MED ORDER — FUROSEMIDE 40 MG PO TABS: 20.0000 mg | ORAL_TABLET | Freq: Every day | ORAL | Status: DC

## 2018-04-14 MED ORDER — LISINOPRIL 20 MG PO TABS
20.0000 mg | ORAL_TABLET | Freq: Every day | ORAL | Status: DC
  Administered 2018-04-15 – 2018-04-19 (×5): 20 mg via ORAL
  Filled 2018-04-14 (×5): qty 1

## 2018-04-14 MED ORDER — ENOXAPARIN SODIUM 40 MG/0.4ML ~~LOC~~ SOLN
40.0000 mg | SUBCUTANEOUS | Status: DC
  Administered 2018-04-15 – 2018-04-19 (×5): 40 mg via SUBCUTANEOUS
  Filled 2018-04-14 (×4): qty 0.4

## 2018-04-14 MED ORDER — SENNOSIDES-DOCUSATE SODIUM 8.6-50 MG PO TABS: 2.0000 | ORAL_TABLET | Freq: Every evening | ORAL | Status: DC | PRN

## 2018-04-14 MED ORDER — ENSURE ENLIVE PO LIQD: 237.0000 mL | Freq: Two times a day (BID) | ORAL | Status: DC

## 2018-04-14 MED ORDER — FUROSEMIDE 20 MG PO TABS
20.0000 mg | ORAL_TABLET | Freq: Once | ORAL | Status: AC
  Administered 2018-04-15: 20 mg via ORAL
  Filled 2018-04-14: qty 1

## 2018-04-14 MED ORDER — PREDNISOLONE ACETATE 1 % OP SUSP
1.0000 [drp] | Freq: Two times a day (BID) | OPHTHALMIC | Status: DC
  Administered 2018-04-14 – 2018-04-19 (×11): 1 [drp] via OPHTHALMIC
  Filled 2018-04-14 (×2): qty 5

## 2018-04-14 MED ORDER — TRAZODONE HCL 50 MG PO TABS
50.0000 mg | ORAL_TABLET | Freq: Once | ORAL | Status: AC
  Administered 2018-04-15: 50 mg via ORAL
  Filled 2018-04-14 (×2): qty 1

## 2018-04-14 MED ORDER — LEVOTHYROXINE SODIUM 100 MCG PO TABS
100.0000 ug | ORAL_TABLET | Freq: Every day | ORAL | Status: DC
  Administered 2018-04-15 – 2018-04-19 (×5): 100 ug via ORAL
  Filled 2018-04-14 (×5): qty 1

## 2018-04-14 NOTE — ED Notes (Signed)
Patient transported to CT 

## 2018-04-14 NOTE — ED Provider Notes (Signed)
[facility]-EMERGENCY DEPT Provider Note   CSN: [id number] Arrival date & time: 04/14/18  1505     History   Chief Complaint Chief Complaint  Patient presents with  . Weakness  . Depression    HPI Tina Wheeler is a 85 y.o. female.  Level 5 caveat secondary to patient's medical condition.  History given by the patient's daughters.  They brought her to this hospital for evaluation of 3 weeks of increased weakness.  The patient has been refusing to eat and is not walking anymore.  She is voiced that she wants to die and wants to be left alone.  They have been giving her Ensure.  There is been no illness symptoms that they have identified, no cough no vomiting no diarrhea no fevers.  She had a minor fall a few days ago where she injured her foot.  They think she may have intentionally taken Tylenol to herself a few days ago.  They live in [place] but she has been to this hospital before and they thought it was a better hospital so they brought her here.  The history is provided by a relative.  Weakness  Severity:  Severe Onset quality:  Gradual Duration:  3 weeks Timing:  Constant Progression:  Unchanged Chronicity:  New Context: not increased activity and not recent infection   Relieved by:  Nothing Worsened by:  Nothing Ineffective treatments:  None tried Associated symptoms: anorexia, difficulty walking, falls and vision change (macular degeneration)   Associated symptoms: no abdominal pain, no chest pain, no fever, no seizures and no shortness of breath   Depression  This is a new problem. The current episode started more than 1 week ago. The problem occurs constantly. The problem has not changed since onset.Pertinent negatives include no chest pain, no abdominal pain and no shortness of breath. Nothing aggravates the symptoms. Nothing relieves the symptoms. She has tried nothing for the symptoms. The treatment provided no relief.    Past Medical History:    Diagnosis Date  . Hypertension   . Hypothyroidism     Patient Active Problem List   Diagnosis Date Noted  . Hyponatremia   . Closed left hip fracture (HCC) 02/02/2017  . Essential hypertension 02/02/2017  . Hypothyroidism 02/02/2017  . Dementia (HCC) 02/02/2017  . Anemia 05/02/2012  . Fracture of tibial plateau, closed 04/27/2012    Past Surgical History:  Procedure Laterality Date  . HIP ARTHROPLASTY Left 02/04/2017   Procedure: ARTHROPLASTY HEMIARTHROPLASTY LEFT hip;  Surgeon: Durene Romans, MD;  Location: [facility];  Service: Orthopedics;  Laterality: Left;  . ORIF TIBIA PLATEAU Right 04/27/2012   Procedure: OPEN REDUCTION INTERNAL FIXATION (ORIF) TIBIAL PLATEAU;  Surgeon: Loanne Drilling, MD;  Location: [facility];  Service: Orthopedics;  Laterality: Right;     OB History   No obstetric history on file.      Home Medications    Prior to Admission medications   Medication Sig Start Date End Date Taking? Authorizing Provider  bisacodyl (DULCOLAX) 10 MG suppository Place 1 suppository (10 mg total) rectally daily as needed. Patient not taking: Reported on 02/03/2017 04/30/12   Julien Girt, Alexzandrew L, PA-C  docusate sodium 100 MG CAPS Take 100 mg by mouth 2 (two) times daily. Patient not taking: Reported on 02/03/2017 04/30/12   Julien Girt, Alexzandrew L, PA-C  ferrous sulfate 325 (65 FE) MG tablet Take 1 tablet (325 mg total) 3 (three) times daily after meals by mouth. 02/06/17   Lanney Gins,  PA-C  furosemide (LASIX) 20 MG tablet TAKE 1 TABLET BY MOUTH EVERY DAY AS NEEDED FOR EDEMA 01/06/17   [provider]  HYDROcodone-acetaminophen (NORCO/VICODIN) 5-325 MG tablet Take 1-2 tablets every 6 (six) hours as needed by mouth for moderate pain. 02/06/17   Lanney GinsBabish, Matthew, PA-C  hydroxypropyl methylcellulose / hypromellose (ISOPTO TEARS / GONIOVISC) 2.5 % ophthalmic solution Place 1 drop 3 (three) times daily as needed into the left eye for dry eyes.    [provider]   KLOR-CON 10 10 MEQ tablet TAKE 1 TABLET BY MOUTH EVERY DAY WITH LASIX 01/17/17   [provider]  levothyroxine (SYNTHROID, LEVOTHROID) 100 MCG tablet Take 100 mcg daily by mouth. 01/06/17   [provider]  lisinopril (PRINIVIL,ZESTRIL) 20 MG tablet Take 20 mg daily by mouth. 01/03/17   [provider]  methocarbamol (ROBAXIN) 500 MG tablet Take 1 tablet (500 mg total) every 6 (six) hours as needed by mouth for muscle spasms. 02/06/17   Lanney GinsBabish, Matthew, PA-C  ondansetron (ZOFRAN) 4 MG tablet Take 1 tablet (4 mg total) by mouth every 6 (six) hours as needed for nausea. Patient not taking: Reported on 02/03/2017 04/30/12   Julien GirtPerkins, Alexzandrew L, PA-C  oxybutynin (DITROPAN) 5 MG tablet TAKE 1 TAB BY MOUTH EVERY DAY 12/27/16   [provider]  polyethylene glycol (MIRALAX / GLYCOLAX) packet Take 17 g by mouth daily as needed. Patient not taking: Reported on 02/03/2017 04/30/12   Julien GirtPerkins, Alexzandrew L, PA-C  prednisoLONE acetate (PRED MILD) 0.12 % ophthalmic suspension Place 1 drop into the right eye 2 (two) times daily. Patient taking differently: Place 1 drop 2 (two) times daily as needed into the right eye. itching 04/30/12   Julien GirtPerkins, Alexzandrew L, PA-C    Family History No family history on file.  Social History Social History   Tobacco Use  . Smoking status: Former Smoker    Types: Cigarettes    Last attempt to quit: 09/07/2011    Years since quitting: 6.6  . Smokeless tobacco: Never Used  Substance Use Topics  . Alcohol use: No  . Drug use: No     Allergies   Patient has no known allergies.   Review of Systems Review of Systems  Constitutional: Negative for fever.  HENT: Negative for nosebleeds.   Eyes: Positive for visual disturbance.  Respiratory: Negative for shortness of breath.   Cardiovascular: Negative for chest pain.  Gastrointestinal: Positive for anorexia. Negative for abdominal pain.  Genitourinary: Negative for hematuria.   Musculoskeletal: Positive for falls and gait problem (weak and shakey).  Skin: Negative for rash.  Neurological: Positive for weakness. Negative for seizures.  Psychiatric/Behavioral: Positive for depression.     Physical Exam Updated Vital Signs BP 130/73   Pulse (!) 101   Temp 98.9 F (37.2 C) (Oral)   Resp 16   Wt 80 kg   SpO2 96%   BMI 30.27 kg/m   Physical Exam Vitals signs and nursing note reviewed.  Constitutional:      General: She is not in acute distress.    Appearance: She is well-developed.  HENT:     Head: Normocephalic and atraumatic.  Eyes:     Conjunctiva/sclera: Conjunctivae normal.  Neck:     Musculoskeletal: Neck supple.  Cardiovascular:     Rate and Rhythm: Normal rate and regular rhythm.     Heart sounds: No murmur.  Pulmonary:     Effort: Pulmonary effort is normal. No respiratory distress.  Breath sounds: Normal breath sounds.  Abdominal:     Palpations: Abdomen is soft.     Tenderness: There is no abdominal tenderness.  Musculoskeletal: Normal range of motion.        General: No deformity.  Skin:    General: Skin is warm and dry.     Capillary Refill: Capillary refill takes less than 2 seconds.  Neurological:     Mental Status: She is alert. Mental status is at baseline.     Comments: She is a very flat affect.  She will follow commands and seems to have symmetric strength all around.  She looks generally deconditioned.  There is no obvious facial droop.  Her speech is quiet and she does not participate much.      ED Treatments / Results  Labs (all labs ordered are listed, but only abnormal results are displayed) Labs Reviewed  COMPREHENSIVE METABOLIC PANEL - Abnormal; Notable for the following components:      Result Value   Sodium 125 (*)    Chloride 92 (*)    Glucose, Bld 117 (*)    Total Protein 5.5 (*)    Albumin 2.8 (*)    AST 43 (*)    All other components within normal limits  ACETAMINOPHEN LEVEL - Abnormal; Notable  for the following components:   Acetaminophen (Tylenol), Serum <10 (*)    All other components within normal limits  CBC - Abnormal; Notable for the following components:   Hemoglobin 11.5 (*)    HCT 34.4 (*)    All other components within normal limits  URINALYSIS, ROUTINE W REFLEX MICROSCOPIC - Abnormal; Notable for the following components:   APPearance CLOUDY (*)    Ketones, ur 5 (*)    Leukocytes, UA LARGE (*)    WBC, UA >50 (*)    Bacteria, UA FEW (*)    Non Squamous Epithelial 0-5 (*)    All other components within normal limits  TSH - Abnormal; Notable for the following components:   TSH 5.844 (*)    All other components within normal limits  URINE CULTURE  ETHANOL  SALICYLATE LEVEL  RAPID URINE DRUG SCREEN, HOSP PERFORMED  LIPASE, BLOOD  TROPONIN I  T4, FREE  CBC WITH DIFFERENTIAL/PLATELET  MAGNESIUM  BASIC METABOLIC PANEL    EKG EKG Interpretation  Date/Time:  Sunday April 14 2018 16:24:36 EST Ventricular Rate:  85 PR Interval:    QRS Duration: 79 QT Interval:  335 QTC Calculation: 399 R Axis:   60 Text Interpretation:  Sinus rhythm Atrial premature complexes in couplets Anterior infarct, old Borderline repolarization abnormality similar pattern to prior 2/14 Confirmed by Meridee Score [id number]) on 04/14/2018 4:39:29 PM Also confirmed by Meridee Score [phone]), editor Lanae Boast [id number])  on 04/14/2018 4:45:55 PM   Radiology Dg Chest 1 View  Result Date: 04/14/2018 CLINICAL DATA:  Weakness EXAM: CHEST  1 VIEW COMPARISON:  02/02/2017 FINDINGS: Cardiac shadows within normal limits. Aortic calcifications are noted. The lungs are well aerated bilaterally. No focal infiltrate or sizable effusion is seen. No bony abnormality is noted. IMPRESSION: No acute abnormality seen. Electronically Signed   By: Alcide Clever M.D.   On: 04/14/2018 17:11   Ct Head Wo Contrast  Result Date: 04/14/2018 CLINICAL DATA:  Weakness EXAM: CT HEAD WITHOUT CONTRAST TECHNIQUE:  Contiguous axial images were obtained from the base of the skull through the vertex without intravenous contrast. COMPARISON:  None. FINDINGS: Brain: Atrophic and chronic white matter ischemic changes are noted. No  findings to suggest acute hemorrhage, acute infarction or space-occupying mass lesion are noted. Vascular: No hyperdense vessel or unexpected calcification. Skull: Normal. Negative for fracture or focal lesion. Sinuses/Orbits: No acute finding. Other: None. IMPRESSION: Chronic atrophic and ischemic changes without acute abnormality. Electronically Signed   By: Alcide Clever M.D.   On: 04/14/2018 17:05   Dg Foot Complete Right  Result Date: 04/14/2018 CLINICAL DATA:  Pain and bruising across the foot, initial encounter EXAM: RIGHT FOOT COMPLETE - 3+ VIEW COMPARISON:  None. FINDINGS: Generalized osteopenia is noted. There are changes consistent with a healing fracture in the distal aspect of the third metatarsal. No other fracture is seen. No soft tissue abnormality is noted. IMPRESSION: Healing third metatarsal fracture distally. No other focal abnormality is noted. Electronically Signed   By: Alcide Clever M.D.   On: 04/14/2018 21:23    Procedures Procedures (including critical care time)  Medications Ordered in ED Medications  levothyroxine (SYNTHROID, LEVOTHROID) tablet 100 mcg (has no administration in time range)  enoxaparin (LOVENOX) injection 40 mg (has no administration in time range)  acetaminophen (TYLENOL) tablet 650 mg (has no administration in time range)    Or  acetaminophen (TYLENOL) suppository 650 mg (has no administration in time range)  prednisoLONE acetate (PRED FORTE) 1 % ophthalmic suspension 1 drop (has no administration in time range)  feeding supplement (ENSURE ENLIVE) (ENSURE ENLIVE) liquid 237 mL (has no administration in time range)  cefTRIAXone (ROCEPHIN) 1 g in sodium chloride 0.9 % 100 mL IVPB (1 g Intravenous New Bag/Given 04/14/18 2219)  senna-docusate  (Senokot-S) tablet 2 tablet (has no administration in time range)  furosemide (LASIX) tablet 20 mg (has no administration in time range)  lisinopril (PRINIVIL,ZESTRIL) tablet 20 mg (has no administration in time range)  sodium chloride 0.9 % bolus 1,000 mL (0 mLs Intravenous Stopped 04/14/18 1905)     Initial Impression / Assessment and Plan / ED Course  I have reviewed the triage vital signs and the nursing notes.  Pertinent labs & imaging results that were available during my care of the patient were reviewed by me and considered in my medical decision making (see chart for details).  Clinical Course as of Apr 14 2305  Wynelle Link Apr 14, 2018  1734 Patient here from [place] for evaluation of 3 weeks of progressive generalized weakness and increased depressive symptoms.  Her family basically feel that she is given up on living.  They said states she wants to die.  The patient herself does not voice that to me, but she does not participate much with history or exam.   [MB]  1756 Patient's lab work started to come back.  She is hyponatremic at 125 but on review of her prior lab values that seems to be about her baseline.   [MB]  2040 Family said they are unable to take her home.  She is too unsteady on her feet.  The tech said she was a maximum assist just getting up to the commode.  We will put a call into the hospital service for possible admission.   [MB]  2108 Discussed with Dr. Willaim Bane from the hospitalist service who will evaluate the patient for admission.   [MB]    Clinical Course User Index [MB] Terrilee Files, MD      Final Clinical Impressions(s) / ED Diagnoses   Final diagnoses:  FTT (failure to thrive) in adult  Depression, unspecified depression type  Lower urinary tract infectious disease  Unsteady gait  ED Discharge Orders    None       Terrilee Files, MD 04/14/18 2308

## 2018-04-14 NOTE — ED Notes (Signed)
Sitter at bedside with patient.

## 2018-04-14 NOTE — ED Triage Notes (Addendum)
Per son-in-law, pt has gotten too weak to walk. Pt has been depressed, not wanting to eat. Pt has been talking about wanting to die.  Pt fell a few days ago, injuring top of right foot.  Pt states she has not had much of an appetite. Family states pt is shaky.  Several days ago, pt got into tylenol and intentionally took more than she should have. Pt is denying SI with this RN.

## 2018-04-14 NOTE — ED Notes (Signed)
ED TO INPATIENT HANDOFF REPORT  Name/Age/Gender Tina Wheeler 85 y.o. female  Code Status    Code Status Orders  (From admission, onward)         Start     Ordered   04/14/18 2139  Do not attempt resuscitation (DNR)  ConDena Billettinuous    Question Answer Comment  In the event of cardiac or respiratory ARREST Do not call a "code blue"   In the event of cardiac or respiratory ARREST Do not perform Intubation, CPR, defibrillation or ACLS   In the event of cardiac or respiratory ARREST Use medication by any route, position, wound care, and other measures to relive pain and suffering. May use oxygen, suction and manual treatment of airway obstruction as needed for comfort.      04/14/18 2140        Code Status History    Date Active Date Inactive Code Status Order ID Comments User Context   02/02/2017 1355 02/06/2017 1953 Full Code [id number]  Osvaldo ShipperKrishnan, Gokul, MD Inpatient   04/27/2012 1513 04/30/2012 1605 Full Code [id number]  Loanne DrillingAluisio, Frank V, MD Inpatient      Home/SNF/Other Home  Chief Complaint No Appetite/ Weakness / Delusional   Level of Care/Admitting Diagnosis ED Disposition    ED Disposition Condition Comment   Admit  [facility] [[id number]]  Level of Care: Med-Surg [16]  Diagnosis: Failure to thrive in adult [358490]  Admitting Physician: Ike BenePARK, CAROLYN J [[id number]]  Attending Physician: Ike BeneRK, CAROLYN J [[id number]]  Estimated length of stay: past midnight tomorrow  Certification:: I certify this patient will need inpatient services for at least 2 midnights  PT Class (Do Not Modify): Inpatient [101]  PT Acc Code (Do Not Modify): Private [1]       Medical History Past Medical History:  Diagnosis Date  . Hypertension   . Hypothyroidism     Allergies Allergies  Allergen Reactions  . Morphine Other (See Comments)    IV Location/Drains/Wounds Patient Lines/Drains/Airways Status   Active Line/Drains/Airways    Name:   Placement date:    Placement time:   Site:   Days:   Peripheral IV 04/14/18 Left Forearm   04/14/18    1715    Forearm   less than 1          Labs/Imaging Results for orders placed or performed during the hospital encounter of 04/14/18 (from the past 48 hour(s))  Comprehensive metabolic panel     Status: Abnormal   Collection Time: 04/14/18  3:24 PM  Result Value Ref Range   Sodium 125 (L) 135 - 145 mmol/L   Potassium 4.4 3.5 - 5.1 mmol/L   Chloride 92 (L) 98 - 111 mmol/L   CO2 24 22 - 32 mmol/L   Glucose, Bld 117 (H) 70 - 99 mg/dL   BUN 14 8 - 23 mg/dL   Creatinine, Ser 3.660.68 0.44 - 1.00 mg/dL   Calcium 9.3 8.9 - 44.010.3 mg/dL   Total Protein 5.5 (L) 6.5 - 8.1 g/dL   Albumin 2.8 (L) 3.5 - 5.0 g/dL   AST 43 (H) 15 - 41 U/L   ALT 22 0 - 44 U/L   Alkaline Phosphatase 124 38 - 126 U/L   Total Bilirubin 0.6 0.3 - 1.2 mg/dL   GFR calc non Af Amer >60 >60 mL/min   GFR calc Af Amer >60 >60 mL/min   Anion gap 9 5 - 15    Comment: Performed at [facility],  [address], [place], [place] [address]  Ethanol     Status: None   Collection Time: 04/14/18  3:24 PM  Result Value Ref Range   Alcohol, Ethyl (B) <10 <10 mg/dL    Comment: (NOTE) Lowest detectable limit for serum alcohol is 10 mg/dL. For medical purposes only. Performed at [facility], [address] [place], [place] [address]   Salicylate level     Status: None   Collection Time: 04/14/18  3:24 PM  Result Value Ref Range   Salicylate Lvl <7.0 2.8 - 30.0 mg/dL    Comment: Performed at [facility], [address], [place], [place] [address]  Acetaminophen level     Status: Abnormal   Collection Time: 04/14/18  3:24 PM  Result Value Ref Range   Acetaminophen (Tylenol), Serum <10 (L) 10 - 30 ug/mL    Comment: (NOTE) Therapeutic concentrations vary significantly. A range of 10-30 ug/mL  may be an effective concentration for many patients. However, some  are best treated at  concentrations outside of this range. Acetaminophen concentrations >150 ug/mL at 4 hours after ingestion  and >50 ug/mL at 12 hours after ingestion are often associated with  toxic reactions. Performed at [facility], [address], [place], [place] [address]   cbc     Status: Abnormal   Collection Time: 04/14/18  3:24 PM  Result Value Ref Range   WBC 8.1 4.0 - 10.5 K/uL   RBC 3.87 3.87 - 5.11 MIL/uL   Hemoglobin 11.5 (L) 12.0 - 15.0 g/dL   HCT 50.0 (L) 37.0 - 48.8 %   MCV 88.9 80.0 - 100.0 fL   MCH 29.7 26.0 - 34.0 pg   MCHC 33.4 30.0 - 36.0 g/dL   RDW 89.1 69.4 - 50.3 %   Platelets 333 150 - 400 K/uL   nRBC 0.0 0.0 - 0.2 %    Comment: Performed at [facility], [address] [place], [place] [address]  Rapid urine drug screen (hospital performed)     Status: None   Collection Time: 04/14/18  3:24 PM  Result Value Ref Range   Opiates NONE DETECTED NONE DETECTED   Cocaine NONE DETECTED NONE DETECTED   Benzodiazepines NONE DETECTED NONE DETECTED   Amphetamines NONE DETECTED NONE DETECTED   Tetrahydrocannabinol NONE DETECTED NONE DETECTED   Barbiturates NONE DETECTED NONE DETECTED    Comment: (NOTE) DRUG SCREEN FOR MEDICAL PURPOSES ONLY.  IF CONFIRMATION IS NEEDED FOR ANY PURPOSE, NOTIFY LAB WITHIN 5 DAYS. LOWEST DETECTABLE LIMITS FOR URINE DRUG SCREEN Drug Class                     Cutoff (ng/mL) Amphetamine and metabolites    1000 Barbiturate and metabolites    200 Benzodiazepine                 200 Tricyclics and metabolites     300 Opiates and metabolites        300 Cocaine and metabolites        300 THC                            50 Performed at [facility], [address], [place], [place] [address]   Urinalysis, Routine w reflex microscopic     Status: Abnormal   Collection Time: 04/14/18  3:57 PM  Result Value Ref Range   Color,  Urine YELLOW YELLOW   APPearance CLOUDY (A) CLEAR   Specific Gravity,  Urine 1.014 1.005 - 1.030   pH 6.0 5.0 - 8.0   Glucose, UA NEGATIVE NEGATIVE mg/dL   Hgb urine dipstick NEGATIVE NEGATIVE   Bilirubin Urine NEGATIVE NEGATIVE   Ketones, ur 5 (A) NEGATIVE mg/dL   Protein, ur NEGATIVE NEGATIVE mg/dL   Nitrite NEGATIVE NEGATIVE   Leukocytes, UA LARGE (A) NEGATIVE   RBC / HPF 11-20 0 - 5 RBC/hpf   WBC, UA >50 (H) 0 - 5 WBC/hpf   Bacteria, UA FEW (A) NONE SEEN   Squamous Epithelial / LPF 0-5 0 - 5   WBC Clumps PRESENT    Mucus PRESENT    Non Squamous Epithelial 0-5 (A) NONE SEEN    Comment: Performed at [facility], [address] [place], [place] [address]  Lipase, blood     Status: None   Collection Time: 04/14/18  4:04 PM  Result Value Ref Range   Lipase 35 11 - 51 U/L    Comment: Performed at [facility], [address] [place], [place] [address]  TSH     Status: Abnormal   Collection Time: 04/14/18  4:04 PM  Result Value Ref Range   TSH 5.844 (H) 0.350 - 4.500 uIU/mL    Comment: Performed by a 3rd Generation assay with a functional sensitivity of <=0.01 uIU/mL. Performed at [facility], [address] [place], [place] [address]   Troponin I - Once     Status: None   Collection Time: 04/14/18  4:04 PM  Result Value Ref Range   Troponin I <0.03 <0.03 ng/mL    Comment: Performed at [facility], [address] [place], [place] [address]   Dg Chest 1 View  Result Date: 04/14/2018 CLINICAL DATA:  Weakness EXAM: CHEST  1 VIEW COMPARISON:  02/02/2017 FINDINGS: Cardiac shadows within normal limits. Aortic calcifications are noted. The lungs are well aerated bilaterally. No focal infiltrate or sizable effusion is seen. No bony abnormality is noted. IMPRESSION: No acute abnormality seen. Electronically Signed   By: Alcide CleverMark  Lukens M.D.   On: 04/14/2018 17:11   Ct Head Wo Contrast  Result Date: 04/14/2018 CLINICAL DATA:  Weakness EXAM: CT HEAD WITHOUT CONTRAST TECHNIQUE:  Contiguous axial images were obtained from the base of the skull through the vertex without intravenous contrast. COMPARISON:  None. FINDINGS: Brain: Atrophic and chronic white matter ischemic changes are noted. No findings to suggest acute hemorrhage, acute infarction or space-occupying mass lesion are noted. Vascular: No hyperdense vessel or unexpected calcification. Skull: Normal. Negative for fracture or focal lesion. Sinuses/Orbits: No acute finding. Other: None. IMPRESSION: Chronic atrophic and ischemic changes without acute abnormality. Electronically Signed   By: Alcide CleverMark  Lukens M.D.   On: 04/14/2018 17:05   Dg Foot Complete Right  Result Date: 04/14/2018 CLINICAL DATA:  Pain and bruising across the foot, initial encounter EXAM: RIGHT FOOT COMPLETE - 3+ VIEW COMPARISON:  None. FINDINGS: Generalized osteopenia is noted. There are changes consistent with a healing fracture in the distal aspect of the third metatarsal. No other fracture is seen. No soft tissue abnormality is noted. IMPRESSION: Healing third metatarsal fracture distally. No other focal abnormality is noted. Electronically Signed   By: Alcide CleverMark  Lukens M.D.   On: 04/14/2018 21:23   EKG Interpretation  Date/Time:  Sunday April 14 2018 16:24:36 EST Ventricular Rate:  85 PR Interval:    QRS Duration: 79 QT Interval:  335 QTC Calculation: 399 R Axis:   60 Text Interpretation:  Sinus rhythm Atrial premature complexes in couplets Anterior infarct, old Borderline repolarization abnormality similar pattern to prior 2/14 Confirmed by Meridee Score [phone]) on 04/14/2018 4:39:29 PM Also confirmed by Meridee Score [phone]), editor Lanae Boast [phone])  on 04/14/2018 4:45:55 PM   Pending Labs Unresulted Labs (From admission, onward)    Start     Ordered   04/15/18 0500  CBC with Differential/Platelet  Daily,   R     04/14/18 2144   04/15/18 0500  Magnesium  Daily,   R     04/14/18 2144   04/15/18 0500  Basic metabolic panel  Daily,   R      04/14/18 2144   04/14/18 2136  Urine culture  ONCE - STAT,   STAT    Question:  Patient immune status  Answer:  Normal   04/14/18 2135   04/14/18 1810  T4, free  Add-on,   STAT     04/14/18 1809          Vitals/Pain Today's Vitals   04/14/18 1730 04/14/18 1800 04/14/18 1830 04/14/18 1900  BP: (!) 148/61 (!) 149/78 (!) 146/62 (!) 152/66  Pulse: 79 80 85 87  Resp: 17 17 16 16   Temp:      TempSrc:      SpO2: 97% 97% 98% 97%  Weight:      PainSc:        Isolation Precautions No active isolations  Medications Medications  levothyroxine (SYNTHROID, LEVOTHROID) tablet 100 mcg (has no administration in time range)  enoxaparin (LOVENOX) injection 40 mg (has no administration in time range)  acetaminophen (TYLENOL) tablet 650 mg (has no administration in time range)    Or  acetaminophen (TYLENOL) suppository 650 mg (has no administration in time range)  prednisoLONE acetate (PRED MILD) 0.12 % ophthalmic suspension 1 drop (has no administration in time range)  feeding supplement (ENSURE ENLIVE) (ENSURE ENLIVE) liquid 237 mL (has no administration in time range)  cefTRIAXone (ROCEPHIN) 1 g in sodium chloride 0.9 % 100 mL IVPB (has no administration in time range)  senna-docusate (Senokot-S) tablet 2 tablet (has no administration in time range)  furosemide (LASIX) tablet 20 mg (has no administration in time range)  lisinopril (PRINIVIL,ZESTRIL) tablet 20 mg (has no administration in time range)  sodium chloride 0.9 % bolus 1,000 mL (0 mLs Intravenous Stopped 04/14/18 1905)    Mobility manual wheelchair

## 2018-04-14 NOTE — H&P (Addendum)
History and Physical  Tina Wheeler ZOX:[id number] DOB: February 26, 1932 DOA: 04/14/2018 1520  Referring physician: Rennis Petty [facility]) PCP: Madolyn Frieze, Etta Quill, MD  Outpatient Specialists: Shuler (orthopedics); [facility] C (ophthal - [facility] [place], [place])  HISTORY   Chief Complaint: reported suicide attempt and failure to thrive  HPI: Tina Wheeler is a 85 y.o. female with hx of HTN, hypothyroidism, L hp partial arthroplasty and hx of R knee fx s/p fixation who presented to [facility] ED fron [place] with reported suicidal ideation and attempt with tylenol ingestion earlier today. Patient self-reports that she took tylenol explicitly to end her life during my interview. Otherwise, patient reports mild pain in R foot since a minor fall a few days ago. Denies any pain or new health issues. When asked about how long she has been feeling suicidal, she states "for awhile." Family members found bottle of tylenol near her bed (unknown number of pills ingested).   Rest of history obtained from family (daughter and son-in-law) whom patient lives with. Patient reportedly was diagnosed with chronic depression several years ago by her provider, but no known suicide attempts. She was raised in foster homes and was separated from her biological sister nearly 40 years ago. Family denies alcohol abuse or other substance abuse. Since her hip fracture and surgery last year, family reports that patient has been increasingly avoiding social interaction and has been expressing guilt that she is a "burden" to her family. This past month, she has been refusing to eat "barely anything" and has been stating that she wanted to "die." No changes in memory noted by the daughter. More concerned by "personality" changes and her refusing social interactions and ADLs.   They also note that she has had chronic intermittent increased urinary frequency (baseline urinary incontinence) but otherwise has not noted any fevers/chills or malodorous  urine. Patient did have a fall where she injured her R foot, but denies head trauma. Reports compliance with synthroid and other medications. Family also reports worsening upper extremity intention tremor over the past several months.   Review of Systems:  - no fevers/chills - no cough - no chest pain, dyspnea on exertion - no edema, PND, orthopnea - no nausea/vomiting; no tarry, melanotic or bloody stools - no dysuria, increased urinary frequency - no weight changes Rest of systems reviewed are negative, except as per above history.   ED course:  Vitals Blood pressure (!) 152/66, pulse 87, temperature 98.9 F (37.2 C), temperature source Oral, resp. rate 16, weight 80 kg, SpO2 97 %. Received IV ceftriaxone 1gm x 1; NS bolus 1L x 1.   Past Medical History:  Diagnosis Date  . Hypertension   . Hypothyroidism    Past Surgical History:  Procedure Laterality Date  . HIP ARTHROPLASTY Left 02/04/2017   Procedure: ARTHROPLASTY HEMIARTHROPLASTY LEFT hip;  Surgeon: Durene Romans, MD;  Location: [facility] ORS;  Service: Orthopedics;  Laterality: Left;  . ORIF TIBIA PLATEAU Right 04/27/2012   Procedure: OPEN REDUCTION INTERNAL FIXATION (ORIF) TIBIAL PLATEAU;  Surgeon: Loanne Drilling, MD;  Location: [facility] ORS;  Service: Orthopedics;  Laterality: Right;    Social History:  reports that she quit smoking about 6 years ago. Her smoking use included cigarettes. She has never used smokeless tobacco. She reports that she does not drink alcohol or use drugs.  Allergies  Allergen Reactions  . Morphine Other (See Comments)    No family history on file.    Prior to Admission medications  Medication Sig Start Date End Date Taking? Authorizing Provider  hydroxypropyl methylcellulose / hypromellose (ISOPTO TEARS / GONIOVISC) 2.5 % ophthalmic solution Place 1 drop 3 (three) times daily as needed into the left eye for dry eyes.   Yes [provider]  levothyroxine (SYNTHROID, LEVOTHROID) 100 MCG tablet  Take 100 mcg by mouth daily before breakfast.  01/06/17  Yes [provider]  lisinopril (PRINIVIL,ZESTRIL) 20 MG tablet Take 20 mg daily by mouth. 01/03/17  Yes [provider]  Multiple Vitamins-Minerals (MULTIVITAMIN ADULT PO) Take 1 tablet by mouth daily.   Yes [provider]  Vitamin D, Ergocalciferol, (DRISDOL) 1.25 MG (50000 UT) CAPS capsule Take 50,000 Units by mouth every 7 (seven) days.   Yes [provider]  bisacodyl (DULCOLAX) 10 MG suppository Place 1 suppository (10 mg total) rectally daily as needed. Patient not taking: Reported on 02/03/2017 04/30/12   Julien Girt, Alexzandrew L, PA-C  docusate sodium 100 MG CAPS Take 100 mg by mouth 2 (two) times daily. Patient not taking: Reported on 02/03/2017 04/30/12   Julien Girt, Alexzandrew L, PA-C  ferrous sulfate 325 (65 FE) MG tablet Take 1 tablet (325 mg total) 3 (three) times daily after meals by mouth. Patient not taking: Reported on 04/14/2018 02/06/17   Lanney Gins, PA-C  lisinopril-hydrochlorothiazide (PRINZIDE,ZESTORETIC) 20-12.5 MG tablet Take 1 tablet by mouth daily.    [provider]  ondansetron (ZOFRAN) 4 MG tablet Take 1 tablet (4 mg total) by mouth every 6 (six) hours as needed for nausea. Patient not taking: Reported on 02/03/2017 04/30/12   Perkins, Alexzandrew L, PA-C  polyethylene glycol (MIRALAX / GLYCOLAX) packet Take 17 g by mouth daily as needed. Patient not taking: Reported on 02/03/2017 04/30/12   Julien Girt, Alexzandrew L, PA-C  prednisoLONE acetate (PRED MILD) 0.12 % ophthalmic suspension Place 1 drop into the right eye 2 (two) times daily. Patient not taking: Reported on 04/14/2018 04/30/12   Julien Girt, Alexzandrew L, PA-C    PHYSICAL EXAM   Temp:  [98.9 F (37.2 C)] 98.9 F (37.2 C) (01/26 1517) Pulse Rate:  [79-101] 87 (01/26 1900) Resp:  [16-24] 16 (01/26 1900) BP: (130-153)/(61-78) 152/66 (01/26 1900) SpO2:  [95 %-98 %] 97 % (01/26 1900) Weight:  [80 kg] 80 kg (01/26  1515)  BP (!) 152/66   Pulse 87   Temp 98.9 F (37.2 C) (Oral)   Resp 16   Wt 80 kg   SpO2 97%   BMI 30.27 kg/m    GEN obese elderly caucasian female; resting in bed  HEENT NCAT EOM intact PERRL; clear oropharynx, no cervical LAD; moist mucus membranes  JVP estimated 5 cm H2O above RA; no HJR ; no carotid bruits b/l ;  CV regular normal rate; normal S1 and S2; no m/r/g or S3/S4; PMI non displaced; no parasternal heave  RESP  Faint end expiratory wheezing at bases; otherwise breathing unlabored and symmetric  ABD soft NT ND +normoactive BS  EXT warm throughout b/l; 2+ pitting peripheral edema to upper shins b/l  PULSES  DP and radials 2+ intact b/l  SKIN/MSK no rashes or lesions; faint bruising over R dorsal foot  NEURO/PSYCH AAOx3; no focal deficits Coarse intention tremor with upper extremities RUE > LUE Reports passive suicidal ideation Reports suicide attempt with tylenol earlier today No AH or VH   DATA   LABS ON ADMISSION:  Basic Metabolic Panel: Recent Labs  Lab 04/14/18 1524  NA 125*  K 4.4  CL 92*  CO2 24  GLUCOSE  117*  BUN 14  CREATININE 0.68  CALCIUM 9.3   CBC: Recent Labs  Lab 04/14/18 1524  WBC 8.1  HGB 11.5*  HCT 34.4*  MCV 88.9  PLT 333   Liver Function Tests: Recent Labs  Lab 04/14/18 1524  AST 43*  ALT 22  ALKPHOS 124  BILITOT 0.6  PROT 5.5*  ALBUMIN 2.8*   Recent Labs  Lab 04/14/18 1604  LIPASE 35   No results for input(s): AMMONIA in the last 168 hours. Coagulation:  Lab Results  Component Value Date   INR 1.5 07/26/2007   INR 1.7 (H) 07/25/2007   INR 1.5 07/24/2007   No results found for: PTT Lactic Acid, Venous:  No results found for: LATICACIDVEN Cardiac Enzymes: Recent Labs  Lab 04/14/18 1604  TROPONINI <0.03   Urinalysis:    Component Value Date/Time   COLORURINE YELLOW 04/14/2018 1557   APPEARANCEUR CLOUDY (A) 04/14/2018 1557   LABSPEC 1.014 04/14/2018 1557   PHURINE 6.0 04/14/2018 1557   GLUCOSEU  NEGATIVE 04/14/2018 1557   HGBUR NEGATIVE 04/14/2018 1557   BILIRUBINUR NEGATIVE 04/14/2018 1557   KETONESUR 5 (A) 04/14/2018 1557   PROTEINUR NEGATIVE 04/14/2018 1557   UROBILINOGEN 0.2 07/24/2007 0357   NITRITE NEGATIVE 04/14/2018 1557   LEUKOCYTESUR LARGE (A) 04/14/2018 1557    BNP (last 3 results) No results for input(s): PROBNP in the last 8760 hours. CBG: No results for input(s): GLUCAP in the last 168 hours.  Radiological Exams on Admission: Dg Chest 1 View  Result Date: 04/14/2018 CLINICAL DATA:  Weakness EXAM: CHEST  1 VIEW COMPARISON:  02/02/2017 FINDINGS: Cardiac shadows within normal limits. Aortic calcifications are noted. The lungs are well aerated bilaterally. No focal infiltrate or sizable effusion is seen. No bony abnormality is noted. IMPRESSION: No acute abnormality seen. Electronically Signed   By: Alcide CleverMark  Lukens M.D.   On: 04/14/2018 17:11   Ct Head Wo Contrast  Result Date: 04/14/2018 CLINICAL DATA:  Weakness EXAM: CT HEAD WITHOUT CONTRAST TECHNIQUE: Contiguous axial images were obtained from the base of the skull through the vertex without intravenous contrast. COMPARISON:  None. FINDINGS: Brain: Atrophic and chronic white matter ischemic changes are noted. No findings to suggest acute hemorrhage, acute infarction or space-occupying mass lesion are noted. Vascular: No hyperdense vessel or unexpected calcification. Skull: Normal. Negative for fracture or focal lesion. Sinuses/Orbits: No acute finding. Other: None. IMPRESSION: Chronic atrophic and ischemic changes without acute abnormality. Electronically Signed   By: Alcide CleverMark  Lukens M.D.   On: 04/14/2018 17:05   Dg Foot Complete Right  Result Date: 04/14/2018 CLINICAL DATA:  Pain and bruising across the foot, initial encounter EXAM: RIGHT FOOT COMPLETE - 3+ VIEW COMPARISON:  None. FINDINGS: Generalized osteopenia is noted. There are changes consistent with a healing fracture in the distal aspect of the third metatarsal. No  other fracture is seen. No soft tissue abnormality is noted. IMPRESSION: Healing third metatarsal fracture distally. No other focal abnormality is noted. Electronically Signed   By: Alcide CleverMark  Lukens M.D.   On: 04/14/2018 21:23    EKG: Independently reviewed. Sinus with PACs, normal ST baseline  I have reviewed the patient's previous electronic chart records, labs, and other data.   ASSESSMENT AND PLAN   Assessment: Tina Wheeler is a 85 y.o. female with hx of HTN, hypothyroidism, L hp partial arthroplasty and hx of R knee fx s/p fixation who p/w suicidal ideation and attempt with tylenol ingestion. Ongoing several months of both depressive and failure to thrive  symptoms per family, complicated by severe deconditioning and other physical barriers (ie R eye blindness). Other acute metabolic/infectious etiologies less likely given chronicity. Another consideration is early onset dementia, although at this time no obvious memory deficits. Tremor also does not appear to be consistent with typical Parkinson's, but also on differential. Plan for psychiatry evaluation as well as PT/OT evaluation to determine safe placement. Of note, patient is a resident of [place] and was discharged from home health this past fall October 2019.   Principal Problem:   Suicide attempt [facility](HCC) Active Problems:   Failure to thrive in adult   Plan:    # Suicidal ideation (recent tylenol ingestion attempt) and passive death wish. Possible hx of chronic depression as per family.  > negative UDS; negative EtOH, tylenol, salicylate levels on admit - acetaminophen level negative < 10 - sitter in place; flight risk  - consult psychiatry in AM - consider empiric zoloft at 50mg  qd starting tomorrow  # Failure to thrive x 1 year: likely multifactorial. > note: patient lives in [place] with daughter's family  - psych eval as above  - PT/OT and SW eval  # UA with +leuk est and WBC. Incontinent at baseline - empiric tx with  ceftriaxone IV - urine cultures pending  # Hypothyroidism (on home synthroid) - free T4 pending - resume home synthroid 100 mcg daily  # Chronic HTN - resume lisinopril 20mg  daily  # Chronic hyponatremia, noted for several years in chart. Unclear etiology, likely multifactorial given peripheral edema, intermittent diuretic use, poor nutritional status   - monitor daily BMP  - starting fluid restriction  - if hyponatremia worsens with gentle lasix, stop diuretics   # Chronic anemia, previously diagnosed IDA. 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) and L eye macular degeneration  - resume eye drops for R eye  - patient reportedly sees ophthal at [place] but no current prescriptions  # Recent fall with R foot bruising  - normal R foot XR    - pain control with prn tylenol  DVT Prophylaxis: lovenox Code Status:  DNR (confirmed with family) Family Communication: daughter and son-in-law  Disposition Plan: admit to inpatient for psych and PT/OT eval; urine infection workup  Patient contact: Extended Emergency Contact Information Primary Emergency Contact: ROBBINS,BRENDA Address: [address]          [place], [place] [id number] [place] Home Phone: [phone] Mobile Phone: [phone] Relation: Daughter  Time spent: > 35 mins  Ike Benearolyn J Park, MD [facility] Pager [phone]  If 7PM-7AM, please contact night-coverage [email or web address] Password [email or web address] 04/14/2018, 9:50 PM

## 2018-04-15 ENCOUNTER — Other Ambulatory Visit: Payer: Self-pay

## 2018-04-15 DIAGNOSIS — R45851 Suicidal ideations: Secondary | ICD-10-CM | POA: Diagnosis not present

## 2018-04-15 DIAGNOSIS — T1491XA Suicide attempt, initial encounter: Secondary | ICD-10-CM | POA: Diagnosis not present

## 2018-04-15 DIAGNOSIS — F332 Major depressive disorder, recurrent severe without psychotic features: Secondary | ICD-10-CM

## 2018-04-15 LAB — CBC WITH DIFFERENTIAL/PLATELET
Abs Immature Granulocytes: 0.05 10*3/uL (ref 0.00–0.07)
Basophils Absolute: 0 10*3/uL (ref 0.0–0.1)
Basophils Relative: 1 %
EOS ABS: 0.1 10*3/uL (ref 0.0–0.5)
Eosinophils Relative: 1 %
HCT: 31.5 % — ABNORMAL LOW (ref 36.0–46.0)
Hemoglobin: 10.2 g/dL — ABNORMAL LOW (ref 12.0–15.0)
Immature Granulocytes: 1 %
Lymphocytes Relative: 17 %
Lymphs Abs: 1.4 10*3/uL (ref 0.7–4.0)
MCH: 29.1 pg (ref 26.0–34.0)
MCHC: 32.4 g/dL (ref 30.0–36.0)
MCV: 90 fL (ref 80.0–100.0)
Monocytes Absolute: 0.8 10*3/uL (ref 0.1–1.0)
Monocytes Relative: 10 %
Neutro Abs: 5.8 10*3/uL (ref 1.7–7.7)
Neutrophils Relative %: 70 %
Platelets: 262 10*3/uL (ref 150–400)
RBC: 3.5 MIL/uL — ABNORMAL LOW (ref 3.87–5.11)
RDW: 14.2 % (ref 11.5–15.5)
WBC: 8.1 10*3/uL (ref 4.0–10.5)
nRBC: 0 % (ref 0.0–0.2)

## 2018-04-15 LAB — BASIC METABOLIC PANEL
Anion gap: 7 (ref 5–15)
BUN: 11 mg/dL (ref 8–23)
CO2: 24 mmol/L (ref 22–32)
Calcium: 8.9 mg/dL (ref 8.9–10.3)
Chloride: 95 mmol/L — ABNORMAL LOW (ref 98–111)
Creatinine, Ser: 0.54 mg/dL (ref 0.44–1.00)
GFR calc Af Amer: >60 mL/min (ref >60)
GFR calc non Af Amer: >60 mL/min (ref >60)
Glucose, Bld: 106 mg/dL — ABNORMAL HIGH (ref 70–99)
Potassium: 4.1 mmol/L (ref 3.5–5.1)
Sodium: 126 mmol/L — ABNORMAL LOW (ref 135–145)

## 2018-04-15 LAB — T4, FREE: FREE T4: 1.36 ng/dL (ref 0.82–1.77)

## 2018-04-15 LAB — MAGNESIUM: Magnesium: 1.9 mg/dL (ref 1.7–2.4)

## 2018-04-15 LAB — SODIUM, URINE, RANDOM: Sodium, Ur: 107 mmol/L

## 2018-04-15 LAB — OSMOLALITY, URINE: Osmolality, Ur: 434 mOsm/kg (ref 300–900)

## 2018-04-15 MED ORDER — SODIUM CHLORIDE 0.9 % IV SOLN
INTRAVENOUS | Status: DC | PRN
  Administered 2018-04-15 – 2018-04-16 (×2): 250 mL via INTRAVENOUS

## 2018-04-15 MED ORDER — MIRTAZAPINE 15 MG PO TBDP
7.5000 mg | ORAL_TABLET | Freq: Every day | ORAL | Status: DC
  Administered 2018-04-15 – 2018-04-19 (×5): 7.5 mg via ORAL
  Filled 2018-04-15 (×5): qty 0.5

## 2018-04-15 MED ORDER — GUAIFENESIN-DM 100-10 MG/5ML PO SYRP
5.0000 mL | ORAL_SOLUTION | ORAL | Status: DC | PRN
  Administered 2018-04-15: 5 mL via ORAL
  Filled 2018-04-15: qty 10

## 2018-04-15 NOTE — Evaluation (Signed)
Physical Therapy Evaluation Patient Details Name: Tina Wheeler MRN: [id number] DOB: May 03, 1931 Today's Date: 04/15/2018   History of Present Illness  85 yo female with PMH of  dementia,hypothyroidism ,hypertension, right leg tibial plateau and fibular fracture, varicose veins , bilateral Hip fractures, admitted 04/14/18 for evaluation of 3 weeks of increased weakness, falls,has been refusing to eat and is not walking anymore , also for  possible  overdose of tylenol, FTT.Marland Kitchen Healing right 5th MT fracture  Clinical Impression  The patient  Required  Max assist for bed mobility. Stood x 2 with RW. No family present. Patient did agree to go to rehab. Pt admitted with above diagnosis. Pt currently with functional limitations due to the deficits listed below (see PT Problem List).  Pt will benefit from skilled PT to increase their independence and safety with mobility to allow discharge to the venue listed below.       Follow Up Recommendations SNF    Equipment Recommendations  None recommended by PT    Recommendations for Other Services       Precautions / Restrictions Precautions Precautions: Fall Precaution Comments: aircast on right ankle Restrictions Weight Bearing Restrictions: No      Mobility  Bed Mobility Overal bed mobility: Needs Assistance Bed Mobility: Supine to Sit;Sit to Supine     Supine to sit: Max assist;HOB elevated Sit to supine: Max assist   General bed mobility comments: assist with legs and trunk , use of bed pad to scoot to bed edge and back into bed.   Transfers Overall transfer level: Needs assistance Equipment used: Rolling walker (2 wheeled) Transfers: Sit to/from Stand Sit to Stand: Mod assist         General transfer comment: x 2 practices to stand from bed, noted audible crepitus of knees.  Ambulation/Gait                Stairs            Wheelchair Mobility    Modified Rankin (Stroke Patients Only)       Balance                                              Pertinent Vitals/Pain Pain Assessment: No/denies pain    Home Living Family/patient expects to be discharged to:: Unsure Living Arrangements: Children               Additional Comments: per previous encounter, luives with daughter    Prior Function           Comments: uses a RW     Hand Dominance   Dominant Hand: Right    Extremity/Trunk Assessment   Upper Extremity Assessment Upper Extremity Assessment: Defer to OT evaluation    Lower Extremity Assessment Lower Extremity Assessment: Generalized weakness;RLE deficits/detail RLE Deficits / Details: aircast on right ankle    Cervical / Trunk Assessment Cervical / Trunk Assessment: Normal  Communication   Communication: HOH  Cognition Arousal/Alertness: Awake/alert Behavior During Therapy: WFL for tasks assessed/performed Overall Cognitive Status: No family/caregiver present to determine baseline cognitive functioning                                 General Comments: oriented to place, not date      General Comments  Exercises     Assessment/Plan    PT Assessment Patient needs continued PT services  PT Problem List Decreased strength;Decreased activity tolerance;Decreased mobility;Decreased knowledge of precautions;Decreased safety awareness;Decreased cognition;Decreased knowledge of use of DME       PT Treatment Interventions DME instruction;Gait training;Functional mobility training;Therapeutic activities;Patient/family education;Therapeutic exercise;Cognitive remediation    PT Goals (Current goals can be found in the Care Plan section)  Acute Rehab PT Goals Patient Stated Goal: agreed to stand, go to rehab PT Goal Formulation: With patient Time For Goal Achievement: 04/29/18 Potential to Achieve Goals: Fair    Frequency Min 2X/week   Barriers to discharge Decreased caregiver support      Co-evaluation                AM-PAC PT "6 Clicks" Mobility  Outcome Measure Help needed turning from your back to your side while in a flat bed without using bedrails?: Total Help needed moving from lying on your back to sitting on the side of a flat bed without using bedrails?: Total Help needed moving to and from a bed to a chair (including a wheelchair)?: Total Help needed standing up from a chair using your arms (e.g., wheelchair or bedside chair)?: Total Help needed to walk in hospital room?: Total Help needed climbing 3-5 steps with a railing? : Total 6 Click Score: 6    End of Session   Activity Tolerance: Patient tolerated treatment well Patient left: in bed;with call bell/phone within reach Nurse Communication: Mobility status PT Visit Diagnosis: Unsteadiness on feet (R26.81)    Time: 8616-8372 PT Time Calculation (min) (ACUTE ONLY): 19 min   Charges:   PT Evaluation $PT Eval Low Complexity: 1 Low          Blanchard Kelch PT Acute Rehabilitation Services Pager [phone] Office [phone]   Rada Hay 04/15/2018, 1:03 PM

## 2018-04-15 NOTE — Progress Notes (Signed)
   04/15/18 1400  Clinical Encounter Type  Visited With Patient and family together  Visit Type Initial;Psychological support;Spiritual support  Referral From Nurse  Consult/Referral To Chaplain  Spiritual Encounters  Spiritual Needs Emotional;Other (Comment) (Spiritual Care Conversation/Support)  Stress Factors  Patient Stress Factors None identified  Family Stress Factors None identified   No needs were identified at this time.    Chaplain Clint Bolder M.Div., [facility]

## 2018-04-15 NOTE — Consult Note (Signed)
[facility]   Reason for Consult:  "suicidal ideation (tylenol attempt); failure to thrive for several months; also concern for dementia per family (only behavioral changes)" Referring Physician:  Dr. Mahala MenghiniSamtani  Patient Identification: Tina LimeRosa L Wheeler MRN:  [id number] Principal Diagnosis: MDD (major depressive disorder), recurrent severe, without psychosis (HCC) Diagnosis:  Principal Problem:   Suicide attempt [facility](HCC) Active Problems:   Failure to thrive in adult   Total Time spent with patient: 1 hour  Subjective:   Tina Wheeler is a 85 y.o. female patient admitted with suicide attempt by overdose.  HPI:   Per chart review, patient was admitted with suicide attempt by tylenol ingestion. An empty bottle of tylenol was found near her bed by her family. She lives with her daughter and son-in-law. She was diagnosed with chronic depression several years ago. She feels as though she is a burden to her family and has been avoiding social interaction. She refuses to eat and has been stating that she wants to die. She recently had a fall and injured her right foot. She has severe deconditioning and right eye blindness. She has not been ambulating. She was given Trazodone 50 mg overnight.   On interview, Ms. Iwan admits to ingesting tylenol a week ago. She reports that she does not remember how many pills she took but reports maybe 10. She reports that she made a bad decision and it was "stupid" to take the medication. She informed her family the following day. She reports that her family is good to her. She endorses depressed mood for a few weeks with poor appetite (significantly improved since hospitalization). She denies changes in her weight. She reports poor sleep. She denies a history of manic symptoms (decreased need for sleep, increased energy, pressured speech or euphoria). She denies SI, HI or AVH. She requires assistance with her ADLs. Her daughter helps her to bathe. She is  able to dress herself. She ambulates with a walker. She provides verbal consent to speak to her family who later came to visit. Her daughter and son-in-law report that she no longer has access to medications at home. They are all properly secured. There are no weapons in the home. She has not endorsed SI since her ingestion and she does not have a history of suicide attempts. She has never received treatment for depression although she has been depressed for several years. She was prescribed Ativan for anxiety but it just causes sedation. Her family is not giving her Ativan.    Past Psychiatric History: Depression and anxiety  Risk to Self:  Low. Denies SI.  Risk to Others:  None. Denies HI.  Prior Inpatient Therapy:  Denies  Prior Outpatient Therapy:  She is followed by her PCP.   Past Medical History:  Past Medical History:  Diagnosis Date  . Hypertension   . Hypothyroidism     Past Surgical History:  Procedure Laterality Date  . HIP ARTHROPLASTY Left 02/04/2017   Procedure: ARTHROPLASTY HEMIARTHROPLASTY LEFT hip;  Surgeon: Durene Romanslin, Matthew, MD;  Location: [facility];  Service: Orthopedics;  Laterality: Left;  . ORIF TIBIA PLATEAU Right 04/27/2012   Procedure: OPEN REDUCTION INTERNAL FIXATION (ORIF) TIBIAL PLATEAU;  Surgeon: Loanne DrillingFrank V Aluisio, MD;  Location: [facility];  Service: Orthopedics;  Laterality: Right;   Family History: No family history on file. Family Psychiatric  History: Denies  Social History:  Social History   Substance and Sexual Activity  Alcohol Use No     Social History  Substance and Sexual Activity  Drug Use No    Social History   Socioeconomic History  . Marital status: Divorced    Spouse name: Not on file  . Number of children: Not on file  . Years of education: Not on file  . Highest education level: Not on file  Occupational History  . Not on file  Social Needs  . Financial resource strain: Not on file  . Food insecurity:    Worry: Not on file     Inability: Not on file  . Transportation needs:    Medical: Not on file    Non-medical: Not on file  Tobacco Use  . Smoking status: Former Smoker    Types: Cigarettes    Last attempt to quit: 09/07/2011    Years since quitting: 6.6  . Smokeless tobacco: Never Used  Substance and Sexual Activity  . Alcohol use: No  . Drug use: No  . Sexual activity: Never    Birth control/protection: Post-menopausal  Lifestyle  . Physical activity:    Days per week: Not on file    Minutes per session: Not on file  . Stress: Not on file  Relationships  . Social connections:    Talks on phone: Not on file    Gets together: Not on file    Attends religious service: Not on file    Active member of club or organization: Not on file    Attends meetings of clubs or organizations: Not on file    Relationship status: Not on file  Other Topics Concern  . Not on file  Social History Narrative  . Not on file   Additional Social History: She is from [place], [place]. She lives with her daughter and son-in-law. She lived in foster homes and was separated from her biological sister 40 years ago. She was a homemaker. She denies alcohol or illicit substance use.     Allergies:   Allergies  Allergen Reactions  . Morphine Other (See Comments)    Labs:  Results for orders placed or performed during the hospital encounter of 04/14/18 (from the past 48 hour(s))  Comprehensive metabolic panel     Status: Abnormal   Collection Time: 04/14/18  3:24 PM  Result Value Ref Range   Sodium 125 (L) 135 - 145 mmol/L   Potassium 4.4 3.5 - 5.1 mmol/L   Chloride 92 (L) 98 - 111 mmol/L   CO2 24 22 - 32 mmol/L   Glucose, Bld 117 (H) 70 - 99 mg/dL   BUN 14 8 - 23 mg/dL   Creatinine, Ser 1.61 0.44 - 1.00 mg/dL   Calcium 9.3 8.9 - 09.6 mg/dL   Total Protein 5.5 (L) 6.5 - 8.1 g/dL   Albumin 2.8 (L) 3.5 - 5.0 g/dL   AST 43 (H) 15 - 41 U/L   ALT 22 0 - 44 U/L   Alkaline Phosphatase 124 38 - 126 U/L   Total Bilirubin  0.6 0.3 - 1.2 mg/dL   GFR calc non Af Amer >60 >60 mL/min   GFR calc Af Amer >60 >60 mL/min   Anion gap 9 5 - 15    Comment: Performed at [facility], [address], [place], [place] [address]  Ethanol     Status: None   Collection Time: 04/14/18  3:24 PM  Result Value Ref Range   Alcohol, Ethyl (B) <10 <10 mg/dL    Comment: (NOTE) Lowest detectable limit for serum alcohol is 10 mg/dL. For  medical purposes only. Performed at [facility], [address] [place], [place] [address]   Salicylate level     Status: None   Collection Time: 04/14/18  3:24 PM  Result Value Ref Range   Salicylate Lvl <7.0 2.8 - 30.0 mg/dL    Comment: Performed at [facility], [address] [place], [place] [address]  Acetaminophen level     Status: Abnormal   Collection Time: 04/14/18  3:24 PM  Result Value Ref Range   Acetaminophen (Tylenol), Serum <10 (L) 10 - 30 ug/mL    Comment: (NOTE) Therapeutic concentrations vary significantly. A range of 10-30 ug/mL  may be an effective concentration for many patients. However, some  are best treated at concentrations outside of this range. Acetaminophen concentrations >150 ug/mL at 4 hours after ingestion  and >50 ug/mL at 12 hours after ingestion are often associated with  toxic reactions. Performed at [facility], [address] [place], [place] [address]   cbc     Status: Abnormal   Collection Time: 04/14/18  3:24 PM  Result Value Ref Range   WBC 8.1 4.0 - 10.5 K/uL   RBC 3.87 3.87 - 5.11 MIL/uL   Hemoglobin 11.5 (L) 12.0 - 15.0 g/dL   HCT 21.334.4 (L) 08.636.0 - 57.846.0 %   MCV 88.9 80.0 - 100.0 fL   MCH 29.7 26.0 - 34.0 pg   MCHC 33.4 30.0 - 36.0 g/dL   RDW 46.914.3 62.911.5 - 52.815.5 %   Platelets 333 150 - 400 K/uL   nRBC 0.0 0.0 - 0.2 %    Comment: Performed at [facility], [address] [place], [place] [address]  Rapid urine drug screen (hospital performed)      Status: None   Collection Time: 04/14/18  3:24 PM  Result Value Ref Range   Opiates NONE DETECTED NONE DETECTED   Cocaine NONE DETECTED NONE DETECTED   Benzodiazepines NONE DETECTED NONE DETECTED   Amphetamines NONE DETECTED NONE DETECTED   Tetrahydrocannabinol NONE DETECTED NONE DETECTED   Barbiturates NONE DETECTED NONE DETECTED    Comment: (NOTE) DRUG SCREEN FOR MEDICAL PURPOSES ONLY.  IF CONFIRMATION IS NEEDED FOR ANY PURPOSE, NOTIFY LAB WITHIN 5 DAYS. LOWEST DETECTABLE LIMITS FOR URINE DRUG SCREEN Drug Class                     Cutoff (ng/mL) Amphetamine and metabolites    1000 Barbiturate and metabolites    200 Benzodiazepine                 200 Tricyclics and metabolites     300 Opiates and metabolites        300 Cocaine and metabolites        300 THC                            50 Performed at [facility], [address] [place], [place] [address]   Urinalysis, Routine w reflex microscopic     Status: Abnormal   Collection Time: 04/14/18  3:57 PM  Result Value Ref Range   Color, Urine YELLOW YELLOW   APPearance CLOUDY (A) CLEAR   Specific Gravity, Urine 1.014 1.005 - 1.030   pH 6.0 5.0 - 8.0   Glucose, UA NEGATIVE NEGATIVE mg/dL   Hgb urine dipstick NEGATIVE NEGATIVE   Bilirubin Urine NEGATIVE NEGATIVE   Ketones, ur 5 (A)  NEGATIVE mg/dL   Protein, ur NEGATIVE NEGATIVE mg/dL   Nitrite NEGATIVE NEGATIVE   Leukocytes, UA LARGE (A) NEGATIVE   RBC / HPF 11-20 0 - 5 RBC/hpf   WBC, UA >50 (H) 0 - 5 WBC/hpf   Bacteria, UA FEW (A) NONE SEEN   Squamous Epithelial / LPF 0-5 0 - 5   WBC Clumps PRESENT    Mucus PRESENT    Non Squamous Epithelial 0-5 (A) NONE SEEN    Comment: Performed at [facility], [address] [place], [place] [address]  Lipase, blood     Status: None   Collection Time: 04/14/18  4:04 PM  Result Value Ref Range   Lipase 35 11 - 51 U/L    Comment: Performed at [facility], [address], [place], [place] [address]  TSH     Status: Abnormal   Collection Time: 04/14/18  4:04 PM  Result Value Ref Range   TSH 5.844 (H) 0.350 - 4.500 uIU/mL    Comment: Performed by a 3rd Generation assay with a functional sensitivity of <=0.01 uIU/mL. Performed at [facility], [address] [place], [place] [address]   Troponin I - Once     Status: None   Collection Time: 04/14/18  4:04 PM  Result Value Ref Range   Troponin I <0.03 <0.03 ng/mL    Comment: Performed at [facility], [address], [place], [place] [address]  T4, free     Status: None   Collection Time: 04/15/18  4:43 AM  Result Value Ref Range   Free T4 1.36 0.82 - 1.77 ng/dL    Comment: (NOTE) Biotin ingestion may interfere with free T4 tests. If the results are inconsistent with the TSH level, previous test results, or the clinical presentation, then consider biotin interference. If needed, order repeat testing after stopping biotin. Performed at [facility], [address] [place], [place] [address]   CBC with Differential/Platelet     Status: Abnormal   Collection Time: 04/15/18  4:43 AM  Result Value Ref Range   WBC 8.1 4.0 - 10.5 K/uL   RBC 3.50 (L) 3.87 - 5.11 MIL/uL   Hemoglobin 10.2 (L) 12.0 - 15.0 g/dL   HCT 96.2 (L) 95.2 - 84.1 %   MCV 90.0 80.0 - 100.0 fL   MCH 29.1 26.0 - 34.0 pg   MCHC 32.4 30.0 - 36.0 g/dL   RDW 32.4 40.1 - 02.7 %   Platelets 262 150 - 400 K/uL   nRBC 0.0 0.0 - 0.2 %   Neutrophils Relative % 70 %   Neutro Abs 5.8 1.7 - 7.7 K/uL   Lymphocytes Relative 17 %   Lymphs Abs 1.4 0.7 - 4.0 K/uL   Monocytes Relative 10 %   Monocytes Absolute 0.8 0.1 - 1.0 K/uL   Eosinophils Relative 1 %   Eosinophils Absolute 0.1 0.0 - 0.5 K/uL   Basophils Relative 1 %   Basophils Absolute 0.0 0.0 - 0.1 K/uL   Immature Granulocytes 1 %   Abs Immature Granulocytes 0.05 0.00 - 0.07 K/uL    Comment: Performed at [facility],  [address], [place], [place] [address]  Magnesium     Status: None   Collection Time: 04/15/18  4:43 AM  Result Value Ref Range   Magnesium 1.9 1.7 - 2.4 mg/dL    Comment: Performed at [facility], [address] [place], [place]  [address]  Basic metabolic panel     Status: Abnormal   Collection Time: 04/15/18  4:43 AM  Result Value Ref Range   Sodium 126 (L) 135 - 145 mmol/L   Potassium 4.1 3.5 - 5.1 mmol/L   Chloride 95 (L) 98 - 111 mmol/L   CO2 24 22 - 32 mmol/L   Glucose, Bld 106 (H) 70 - 99 mg/dL   BUN 11 8 - 23 mg/dL   Creatinine, Ser 6.04 0.44 - 1.00 mg/dL   Calcium 8.9 8.9 - 54.0 mg/dL   GFR calc non Af Amer >60 >60 mL/min   GFR calc Af Amer >60 >60 mL/min   Anion gap 7 5 - 15    Comment: Performed at [facility], [address] [place], [place] [address]    Current Facility-Administered Medications  Medication Dose Route Frequency Provider Last Rate Last Dose  . acetaminophen (TYLENOL) tablet 650 mg  650 mg Oral Q6H PRN Park, Bartholome Bill, MD       Or  . acetaminophen (TYLENOL) suppository 650 mg  650 mg Rectal Q6H PRN Park, Bartholome Bill, MD      . cefTRIAXone (ROCEPHIN) 1 g in sodium chloride 0.9 % 100 mL IVPB  1 g Intravenous QHS Park, Bartholome Bill, MD   Stopped at 04/14/18 2250  . enoxaparin (LOVENOX) injection 40 mg  40 mg Subcutaneous Q24H Park, Bartholome Bill, MD      . feeding supplement (ENSURE ENLIVE) (ENSURE ENLIVE) liquid 237 mL  237 mL Oral Q24H Park, Bartholome Bill, MD      . furosemide (LASIX) tablet 20 mg  20 mg Oral Once Park, Bartholome Bill, MD      . guaiFENesin-dextromethorphan (ROBITUSSIN DM) 100-10 MG/5ML syrup 5 mL  5 mL Oral Q4H PRN Ike Bene, MD   5 mL at 04/15/18 0244  . levothyroxine (SYNTHROID, LEVOTHROID) tablet 100 mcg  100 mcg Oral QAC breakfast Ike Bene, MD   100 mcg at 04/15/18 0547  . lisinopril (PRINIVIL,ZESTRIL) tablet 20 mg  20 mg Oral Daily Park, Bartholome Bill, MD      . prednisoLONE acetate (PRED FORTE)  1 % ophthalmic suspension 1 drop  1 drop Right Eye BID Ike Bene, MD   1 drop at 04/14/18 2245  . senna-docusate (Senokot-S) tablet 2 tablet  2 tablet Oral QHS PRN Park, Bartholome Bill, MD        Musculoskeletal: Strength & Muscle Tone: Generalized weakness. Gait & Station: UTA since patient is lying in bed. Patient leans: N/A  Psychiatric Specialty Exam: Physical Exam  Nursing note and vitals reviewed. Constitutional: She is oriented to person, place, and time. She appears well-developed and well-nourished.  HENT:  Head: Normocephalic and atraumatic.  Neck: Normal range of motion.  Respiratory: Effort normal.  Musculoskeletal: Normal range of motion.  Neurological: She is alert and oriented to person, place, and time.  Psychiatric: Her speech is normal and behavior is normal. Judgment and thought content normal. Cognition and memory are impaired. She exhibits a depressed mood.    Review of Systems  Cardiovascular: Negative for chest pain.  Gastrointestinal: Negative for abdominal pain, constipation, diarrhea, nausea and vomiting.  Psychiatric/Behavioral: Positive for depression. Negative for hallucinations, substance abuse and suicidal ideas. The patient has insomnia.   All other systems reviewed and are negative.   Blood pressure (!) 159/80, pulse 95, temperature 98.5 F (36.9 C), temperature source Oral, resp. rate 18, height 5\' 8"  (1.727 m), weight 80 kg, SpO2  97 %.Body mass index is 26.82 kg/m.  General Appearance: Fairly Groomed, overweight, elderly, Caucasian female, wearing corrective lenses and a hospital gown who is lying in bed. NAD.  Eye Contact:  Good  Speech:  Clear and Coherent and Normal Rate  Volume:  Normal  Mood:  Depressed  Affect:  Congruent  Thought Process:  Linear and Descriptions of Associations: Intact  Orientation:  Full (Time, Place, and Person)  Thought Content:  Logical  Suicidal Thoughts:  No  Homicidal Thoughts:  No  Memory:  Immediate;    Fair Recent;   Fair Remote;   Fair  Judgement:  Fair  Insight:  Fair  Psychomotor Activity:  Normal  Concentration:  Concentration: Good and Attention Span: Good  Recall:  [employer] of Knowledge:  Fair  Language:  Good  Akathisia:  No  Handed:  Right  AIMS (if indicated):   N/A  Assets:  Communication Skills Desire for Improvement Financial Resources/Insurance Housing Social Support  ADL's:  Impaired  Cognition: Impaired with short term memory deficits.   Sleep:   Poor   Assessment:  Tina Wheeler is a 85 y.o. female who was admitted with suicide attempt by Tylenol overdose and failure to thrive. Patient ingested Tylenol a week ago. She denies SI and acknowledges that she exercised poor judgement. She does admit to depression, poor sleep and poor appetite (improved since hospitalization). Over the past week, she has not attempted to harm herself again and family report that there are no access to guns or weapons at home. She has never been treated for depression. Recommend Remeron for mood, anxiety, insomnia and poor appetite. Patient does not warrant inpatient psychiatric hospitalization at this time. She is at home 24/7 with family and family feels like SNF/rehab placement is best for patient at this time.   Treatment Plan Summary: -Start Remeron 7.5 mg qhs for mood, anxiety, poor appetite and insomnia.  -EKG reviewed and QTc 399 on 1/26. Please closely monitor when starting or increasing QTc prolonging agents.  -Please have SW provide patient with outpatient mental health resources.  -Will sign off on patient at this time. Please consult psychiatry again as needed.     Disposition: No evidence of imminent risk to self or others at present.    Cherly Beach, DO 04/15/2018 10:03 AM

## 2018-04-15 NOTE — Progress Notes (Signed)
OT Cancellation Note  Patient Details Name: Tina Wheeler L Desch MRN: [id number] DOB: 11-Feb-1932   Cancelled Treatment:    Reason Eval/Treat Not Completed: Other (comment); pt sleeping soundly upon entering room, safety sitter present and reports pt did not sleep well last night. Will follow up for OT eval at a later time.  Marcy SirenBreanna Donatello Kleve, OT [facility] Pager [phone] Office [phone]   Orlando PennerBreanna L Lexie Morini 04/15/2018, 3:16 PM

## 2018-04-15 NOTE — Progress Notes (Signed)
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 Right foot bruising secondary to fall -I will place her in an Aircast walker she will need follow-up x-rays in about 3 to 4 weeks to denote healing she does have a small fracture but this is nonoperable--PT can see her and ambulate her from my perspective   DVT Lovenox code Status: Full communication: No family is present at  this time disposition Plan: Inpatient   Stela Iwasaki, MD  [facility] Via amion app OR [email or web address] 7PM-7AM contact night coverage as above 04/15/2018, 10:11 AM  LOS: 1 day   Consultants:  None  Procedures:  No  Antimicrobials:  Ceftriaxone  Interval history/Subjective: Awake alert but does not orient completely-cannot tell me clearly where she is-also does not know that she is at [employer] thinks that she is at [facility] reports that she was able to eat breakfast this morning she has not been up and about because of concern for fracture toe right foot  Objective:  Vitals:  Vitals:   04/15/18 0258 04/15/18 0443  BP: (!) 169/67 (!) 159/80  Pulse: 84 95  Resp: 20 18  Temp: 97.9 F (36.6 C) 98.5 F (36.9 C)  SpO2: 98% 97%    Exam:  Awake alert confused looks about stated age sunken eyes by temporalis wasting Chest clinically clear no added sound no rales no rhonchi S1-S2 no murmur rub or gallop Abdomen soft nontender Neurologically power is 5/5 extraocular movements grossly intact Psych is flat in terms of affect   I have personally reviewed the following:  DATA   Labs:  No labs today  Imaging studies:  No   Scheduled Meds: . enoxaparin (LOVENOX) injection  40 mg Subcutaneous Q24H  . feeding supplement (ENSURE ENLIVE)  237 mL Oral Q24H  . furosemide  20 mg Oral Once  . levothyroxine  100 mcg Oral QAC breakfast  . lisinopril  20 mg Oral Daily  . prednisoLONE acetate  1 drop Right Eye BID   Continuous Infusions: . cefTRIAXone (ROCEPHIN)  IV Stopped (04/14/18 2250)    Principal Problem:   Suicide attempt [facility]) Active Problems:  Failure to thrive in adult   LOS: 1 day

## 2018-04-16 DIAGNOSIS — R45851 Suicidal ideations: Secondary | ICD-10-CM | POA: Diagnosis not present

## 2018-04-16 DIAGNOSIS — R627 Adult failure to thrive: Secondary | ICD-10-CM

## 2018-04-16 DIAGNOSIS — N390 Urinary tract infection, site not specified: Secondary | ICD-10-CM

## 2018-04-16 DIAGNOSIS — T1491XA Suicide attempt, initial encounter: Secondary | ICD-10-CM | POA: Diagnosis not present

## 2018-04-16 DIAGNOSIS — E871 Hypo-osmolality and hyponatremia: Secondary | ICD-10-CM | POA: Diagnosis not present

## 2018-04-16 DIAGNOSIS — F332 Major depressive disorder, recurrent severe without psychotic features: Secondary | ICD-10-CM | POA: Diagnosis not present

## 2018-04-16 LAB — RENAL FUNCTION PANEL
Albumin: 2.5 g/dL — ABNORMAL LOW (ref 3.5–5.0)
Anion gap: 7 (ref 5–15)
BUN: 14 mg/dL (ref 8–23)
CO2: 27 mmol/L (ref 22–32)
Calcium: 9.3 mg/dL (ref 8.9–10.3)
Chloride: 92 mmol/L — ABNORMAL LOW (ref 98–111)
Creatinine, Ser: 0.63 mg/dL (ref 0.44–1.00)
GFR calc non Af Amer: >60 mL/min (ref >60)
Glucose, Bld: 117 mg/dL — ABNORMAL HIGH (ref 70–99)
Phosphorus: 3.1 mg/dL (ref 2.5–4.6)
Potassium: 3.9 mmol/L (ref 3.5–5.1)
Sodium: 126 mmol/L — ABNORMAL LOW (ref 135–145)

## 2018-04-16 LAB — BASIC METABOLIC PANEL
Anion gap: 8 (ref 5–15)
BUN: 14 mg/dL (ref 8–23)
CO2: 27 mmol/L (ref 22–32)
Calcium: 9.4 mg/dL (ref 8.9–10.3)
Chloride: 92 mmol/L — ABNORMAL LOW (ref 98–111)
Creatinine, Ser: 0.67 mg/dL (ref 0.44–1.00)
GFR calc Af Amer: >60 mL/min (ref >60)
GFR calc non Af Amer: >60 mL/min (ref >60)
Glucose, Bld: 118 mg/dL — ABNORMAL HIGH (ref 70–99)
Potassium: 4 mmol/L (ref 3.5–5.1)
Sodium: 127 mmol/L — ABNORMAL LOW (ref 135–145)

## 2018-04-16 LAB — CBC WITH DIFFERENTIAL/PLATELET
Abs Immature Granulocytes: 0.04 10*3/uL (ref 0.00–0.07)
Basophils Absolute: 0.1 10*3/uL (ref 0.0–0.1)
Basophils Relative: 1 %
Eosinophils Absolute: 0.1 10*3/uL (ref 0.0–0.5)
Eosinophils Relative: 1 %
HCT: 32.4 % — ABNORMAL LOW (ref 36.0–46.0)
Hemoglobin: 10.4 g/dL — ABNORMAL LOW (ref 12.0–15.0)
Immature Granulocytes: 0 %
LYMPHS ABS: 1.8 10*3/uL (ref 0.7–4.0)
LYMPHS PCT: 20 %
MCH: 28.4 pg (ref 26.0–34.0)
MCHC: 32.1 g/dL (ref 30.0–36.0)
MCV: 88.5 fL (ref 80.0–100.0)
Monocytes Absolute: 1 10*3/uL (ref 0.1–1.0)
Monocytes Relative: 11 %
Neutro Abs: 6 10*3/uL (ref 1.7–7.7)
Neutrophils Relative %: 67 %
Platelets: 282 10*3/uL (ref 150–400)
RBC: 3.66 MIL/uL — ABNORMAL LOW (ref 3.87–5.11)
RDW: 14.3 % (ref 11.5–15.5)
WBC: 9 10*3/uL (ref 4.0–10.5)
nRBC: 0 % (ref 0.0–0.2)

## 2018-04-16 LAB — MAGNESIUM: Magnesium: 2 mg/dL (ref 1.7–2.4)

## 2018-04-16 MED ORDER — ENSURE ENLIVE PO LIQD
237.0000 mL | Freq: Two times a day (BID) | ORAL | Status: DC
  Administered 2018-04-16 – 2018-04-19 (×7): 237 mL via ORAL

## 2018-04-16 NOTE — Care Management Obs Status (Signed)
MEDICARE OBSERVATION STATUS NOTIFICATION   Patient Details  Name: Tina Wheeler MRN: [id number] Date of Birth: Apr 23, 1931   Medicare Observation Status Notification Given:  Yes    Bartholome Bill, RN 04/16/2018, 9:31 AM

## 2018-04-16 NOTE — Progress Notes (Signed)
PROGRESS NOTE  Tina Wheeler XBM:[id number] DOB: [birth year] DOA: 04/14/2018 PCP: Shelly Rubensteinh Park, Angela J, MD   LOS: 2 days   Brief narrative:  Tina Wheeler is a 85 y.o. female with hx of HTN, hypothyroidism, history of left hip partial arthroplasty and right knee surgery presented to the hospital with complaints of suicidal ideation and attempt with Tylenol ingestion. Patient reportedly was diagnosed with chronic depression several years ago by her provider, but no known suicide attempts. She was raised in foster homes and was separated from her biological sister nearly 40 years ago. Family denies alcohol abuse or other substance abuse. Since her hip fracture and surgery last year, family reports that patient has been increasingly avoiding social interaction and has been expressing guilt that she is a "burden" to her family. This past month, she has been refusing to eat "barely anything" and has been stating that she wanted to "die.  Family also noted chronic intermittent increased urinary frequency (baseline urinary incontinence) but otherwise has not noted any fevers/chills or malodorous urine. Patient did have a fall where she injured her R foot, but denies head trauma.   Assessment/Plan:  Principal Problem:   MDD (major depressive disorder), recurrent severe, without psychosis (HCC) Active Problems:   Failure to thrive in adult   Suicide attempt [facility](HCC)  Suicidal ideation on presentation and attempt at Tylenol overdose.  Patient has been seen by psychiatry.  She has been deemed low risk for hurting herself at this time.  Will discontinue one-to-one sitter.  She has been started on Remeron at this time.  Negative urine drug screen or alcohol Tylenol on admission.  Failure to thrive poor oral intake.  Will get nutrition evaluation.  Abnormal urinalysis.  Urinary incontinence at baseline.  On empiric Rocephin.  Follow urine cultures E. coli.  Plan to complete Rocephin course.  History of  hypothyroidism.  On Synthroid.  States was elevated at 5.8.  Will need outpatient follow-up with TSH in 3 to 4 weeks.  Free T4 was 1.3  Essential HTN. continue lisinopril 20mg  daily  Chronic hyponatremia.  Previously thought to have SIADH.  Continue fluid restriction.  Hold Lasix.  Urinary sodium was 107 with urinary osmolality of 434.   Chronic multifactorial anemia.  Hold off with iron supplements at the time due to poor oral intake.    Mild bilateral lower extremity edema chronic.     Recent fall with R foot bruising.  X-ray was unremarkable.  Pain control.  Continue on splint.  Therapy to continue ambulation.          VTE Prophylaxis: Lovenox  Code Status: DNR  Family Communication: No one is available at bedside today  Disposition Plan: Skilled nursing facility, physical, Occupational Therapy to follow.   Consultants:  Psychiatry  Procedures:  None  Antibiotics: Anti-infectives (From admission, onward)   Start     Dose/Rate Route Frequency Ordered Stop   04/14/18 2215  cefTRIAXone (ROCEPHIN) 1 g in sodium chloride 0.9 % 100 mL IVPB     1 g 200 mL/hr over 30 Minutes Intravenous Daily at bedtime 04/14/18 2144     04/14/18 2145  cefTRIAXone (ROCEPHIN) 1 g in sodium chloride 0.9 % 100 mL IVPB  Status:  Discontinued     1 g 200 mL/hr over 30 Minutes Intravenous  Once 04/14/18 2135 04/14/18 2144       Subjective: Patient denies interval complaints but complains of mild leg pain.  Poor historian.  Patient is disoriented.  Objective:  Vitals:   04/15/18 2124 04/16/18 0622  BP: (!) 146/93 (!) 171/88  Pulse: 96 95  Resp: 16 14  Temp: 98.6 F (37 C) 98.2 F (36.8 C)  SpO2: 97% 100%    Intake/Output Summary (Last 24 hours) at 04/16/2018 1053 Last data filed at 04/16/2018 0505 Gross per 24 hour  Intake 1607.54 ml  Output 1250 ml  Net 357.54 ml   Filed Weights   04/14/18 1515 04/15/18 0258  Weight: 80 kg 80 kg   Body mass index is 26.82 kg/m.   Physical  Exam: GENERAL: Patient is alert awake but disoriented. Not in obvious distress.  Obese HENT: No scleral pallor or icterus. Pupils equally reactive to light. Oral mucosa is moist NECK: is supple, no palpable thyroid enlargement. CHEST: Clear to auscultation. No crackles or wheezes. Non tender on palpation. Diminished breath sounds bilaterally. CVS: S1 and S2 heard, no murmur. Regular rate and rhythm. No pericardial rub. ABDOMEN: Soft, non-tender, bowel sounds are present. No palpable hepato-splenomegaly.  EXTREMITIES: No edema.  Right leg on a splint CNS: Cranial nerves are intact.  Moves all extremities SKIN: warm and dry without rashes.  Data Review: I have personally reviewed the following laboratory data and studies,  CBC: Recent Labs  Lab 04/14/18 1524 04/15/18 0443 04/16/18 0503  WBC 8.1 8.1 9.0  NEUTROABS  --  5.8 6.0  HGB 11.5* 10.2* 10.4*  HCT 34.4* 31.5* 32.4*  MCV 88.9 90.0 88.5  PLT 333 262 282   Basic Metabolic Panel: Recent Labs  Lab 04/14/18 1524 04/15/18 0443 04/16/18 0503  NA 125* 126* 126*  127*  K 4.4 4.1 3.9  4.0  CL 92* 95* 92*  92*  CO2 24 24 27  27   GLUCOSE 117* 106* 117*  118*  BUN 14 11 14  14   CREATININE 0.68 0.54 0.63  0.67  CALCIUM 9.3 8.9 9.3  9.4  MG  --  1.9 2.0  PHOS  --   --  3.1   Liver Function Tests: Recent Labs  Lab 04/14/18 1524 04/16/18 0503  AST 43*  --   ALT 22  --   ALKPHOS 124  --   BILITOT 0.6  --   PROT 5.5*  --   ALBUMIN 2.8* 2.5*   Recent Labs  Lab 04/14/18 1604  LIPASE 35   No results for input(s): AMMONIA in the last 168 hours. Cardiac Enzymes: Recent Labs  Lab 04/14/18 1604  TROPONINI <0.03   BNP (last 3 results) No results for input(s): BNP in the last 8760 hours.  ProBNP (last 3 results) No results for input(s): PROBNP in the last 8760 hours.  CBG: No results for input(s): GLUCAP in the last 168 hours. Recent Results (from the past 240 hour(s))  Urine culture     Status: Abnormal  (Preliminary result)   Collection Time: 04/14/18  9:36 PM  Result Value Ref Range Status   Specimen Description   Final    URINE, RANDOM Performed at [facility], [address] [place], [place] [address]    Special Requests   Final    Normal Performed at [facility], [address] [place], [place] [address]    Culture (A)  Final    80,000 COLONIES/mL ESCHERICHIA COLI SUSCEPTIBILITIES TO FOLLOW Performed at [facility], [address] [place], [place] [address]    Report Status PENDING  Incomplete     Studies: Dg Chest 1 View  Result Date: 04/14/2018 CLINICAL  DATA:  Weakness EXAM: CHEST  1 VIEW COMPARISON:  02/02/2017 FINDINGS: Cardiac shadows within normal limits. Aortic calcifications are noted. The lungs are well aerated bilaterally. No focal infiltrate or sizable effusion is seen. No bony abnormality is noted. IMPRESSION: No acute abnormality seen. Electronically Signed   By: Alcide Clever M.D.   On: 04/14/2018 17:11   Ct Head Wo Contrast  Result Date: 04/14/2018 CLINICAL DATA:  Weakness EXAM: CT HEAD WITHOUT CONTRAST TECHNIQUE: Contiguous axial images were obtained from the base of the skull through the vertex without intravenous contrast. COMPARISON:  None. FINDINGS: Brain: Atrophic and chronic white matter ischemic changes are noted. No findings to suggest acute hemorrhage, acute infarction or space-occupying mass lesion are noted. Vascular: No hyperdense vessel or unexpected calcification. Skull: Normal. Negative for fracture or focal lesion. Sinuses/Orbits: No acute finding. Other: None. IMPRESSION: Chronic atrophic and ischemic changes without acute abnormality. Electronically Signed   By: Alcide Clever M.D.   On: 04/14/2018 17:05   Dg Foot Complete Right  Result Date: 04/14/2018 CLINICAL DATA:  Pain and bruising across the foot, initial encounter EXAM: RIGHT FOOT COMPLETE - 3+ VIEW COMPARISON:  None. FINDINGS: Generalized  osteopenia is noted. There are changes consistent with a healing fracture in the distal aspect of the third metatarsal. No other fracture is seen. No soft tissue abnormality is noted. IMPRESSION: Healing third metatarsal fracture distally. No other focal abnormality is noted. Electronically Signed   By: Alcide Clever M.D.   On: 04/14/2018 21:23    Scheduled Meds: . enoxaparin (LOVENOX) injection  40 mg Subcutaneous Q24H  . feeding supplement (ENSURE ENLIVE)  237 mL Oral Q24H  . levothyroxine  100 mcg Oral QAC breakfast  . lisinopril  20 mg Oral Daily  . mirtazapine  7.5 mg Oral QHS  . prednisoLONE acetate  1 drop Right Eye BID    Continuous Infusions: . sodium chloride 10 mL/hr at 04/16/18 0505  . cefTRIAXone (ROCEPHIN)  IV Stopped (04/15/18 2219)     Joycelyn Das, MD  [facility] 04/16/2018

## 2018-04-16 NOTE — Evaluation (Signed)
Occupational Therapy Evaluation Patient Details Name: Tina Wheeler MRN: [id number] DOB: 08/10/1931 Today's Date: 04/16/2018    History of Present Illness 85 yo female with PMH of  dementia,hypothyroidism ,hypertension, right leg tibial plateau and fibular fracture, varicose veins , bilateral Hip fractures, admitted 04/14/18 for evaluation of 3 weeks of increased weakness, falls,has been refusing to eat and is not walking anymore , also for  possible  overdose of tylenol, FTT.Marland Kitchen Healing right 5th MT fracture   Clinical Impression   Pt admitted with the above diagnosis and has the deficits listed below. Pt would benefit from cont OT to increase independence with basic adls and adl transfers back to baseline so pt can d/c home with her children.  Pt states she currently is home alone at times which is not ideal in her current state.  Rec SNF for rehab to attempt to get pt back to baseline with adls.     Follow Up Recommendations  SNF;Supervision/Assistance - 24 hour    Equipment Recommendations  Other (comment)(tbd)    Recommendations for Other Services       Precautions / Restrictions Precautions Precautions: Fall Precaution Comments: aircast on right ankle Restrictions Weight Bearing Restrictions: No      Mobility Bed Mobility Overal bed mobility: Needs Assistance Bed Mobility: Supine to Sit;Sit to Supine     Supine to sit: Max assist;HOB elevated Sit to supine: Max assist   General bed mobility comments: assist with legs and trunk , use of bed pad to scoot to bed edge and back into bed.   Transfers Overall transfer level: Needs assistance Equipment used: Rolling walker (2 wheeled) Transfers: Sit to/from Stand Sit to Stand: Max assist         General transfer comment: Pt requires heavy outside assist to stand.    Balance Overall balance assessment: Needs assistance Sitting-balance support: Feet supported Sitting balance-Leahy Scale: Fair     Standing balance  support: Bilateral upper extremity supported;During functional activity Standing balance-Leahy Scale: Poor Standing balance comment: Pt must have outside support to stand                           ADL either performed or assessed with clinical judgement   ADL Overall ADL's : Needs assistance/impaired Eating/Feeding: Set up;Sitting   Grooming: Wash/dry hands;Wash/dry face;Oral care;Set up;Sitting   Upper Body Bathing: Set up;Sitting   Lower Body Bathing: Sit to/from stand;Maximal assistance;Cueing for compensatory techniques   Upper Body Dressing : Minimal assistance;Sitting   Lower Body Dressing: Maximal assistance;Sit to/from stand;Cueing for compensatory techniques   Toilet Transfer: Maximal assistance;BSC;RW;Stand-pivot   Toileting- Clothing Manipulation and Hygiene: Total assistance;+2 for physical assistance;Sit to/from stand;Cueing for compensatory techniques       Functional mobility during ADLs: Maximal assistance;Rolling walker General ADL Comments: Pt with a great amount of assist needed in standing making all LE adls very difficult as well as toileting and all transfers.     Vision Patient Visual Report: No change from baseline Additional Comments: wears glasses all the time     Perception Perception Perception Tested?: Yes   Praxis Praxis Praxis tested?: Not tested    Pertinent Vitals/Pain Pain Assessment: No/denies pain     Hand Dominance Right   Extremity/Trunk Assessment Upper Extremity Assessment Upper Extremity Assessment: Generalized weakness   Lower Extremity Assessment Lower Extremity Assessment: Defer to PT evaluation RLE Deficits / Details: aircast on right ankle   Cervical / Trunk Assessment Cervical / Trunk  Assessment: Normal   Communication Communication Communication: HOH   Cognition Arousal/Alertness: Awake/alert Behavior During Therapy: WFL for tasks assessed/performed Overall Cognitive Status: No family/caregiver  present to determine baseline cognitive functioning                                 General Comments: not oriented to place or date.  Could not recall city she lives in   [place]  Pt requires a great amount more assist than she did 2 weeks ago with mobility and adls. Pt should not be home alone at all.    Exercises     Shoulder Instructions      Home Living Family/patient expects to be discharged to:: Unsure Living Arrangements: Children                               Additional Comments: pt lives with daughter who is not home 24/7 with pt per pt.      Prior Functioning/Environment Level of Independence: Needs assistance  Gait / Transfers Assistance Needed: walks with walker but hasnt much in last 2 weeks ADL's / Homemaking Assistance Needed: children assist pt. Pt can sponge bathe herself, toilet herself and dress herself.  Gets assist to get into shower and to cook/clean/drive.   Comments: uses a RW        OT Problem List: Impaired balance (sitting and/or standing);Decreased cognition;Decreased safety awareness;Decreased knowledge of use of DME or AE      OT Treatment/Interventions: Self-care/ADL training;Therapeutic activities;DME and/or AE instruction    OT Goals(Current goals can be found in the care plan section) Acute Rehab OT Goals Patient Stated Goal: to get stronger OT Goal Formulation: With patient Time For Goal Achievement: 04/30/18 Potential to Achieve Goals: Good ADL Goals Pt Will Perform Lower Body Bathing: with min assist;sit to/from stand Pt Will Perform Lower Body Dressing: with min assist;sit to/from stand Pt Will Transfer to Toilet: with min assist;stand pivot transfer;ambulating Pt Will Perform Toileting - Clothing Manipulation and hygiene: with min assist;sit to/from stand  OT Frequency: Min 2X/week   Barriers to D/C: Decreased caregiver support  Pt is home alone at times.       Co-evaluation               AM-PAC OT "6 Clicks" Daily Activity     Outcome Measure Help from another person eating meals?: None Help from another person taking care of personal grooming?: None Help from another person toileting, which includes using toliet, bedpan, or urinal?: A Lot Help from another person bathing (including washing, rinsing, drying)?: A Lot Help from another person to put on and taking off regular upper body clothing?: A Little Help from another person to put on and taking off regular lower body clothing?: A Lot 6 Click Score: 17   End of Session Equipment Utilized During Treatment: Gait belt;Rolling walker Nurse Communication: Mobility status  Activity Tolerance: Patient tolerated treatment well Patient left: in chair;with call bell/phone within reach;with chair alarm set  OT Visit Diagnosis: Unsteadiness on feet (R26.81);History of falling (Z91.81)                Time: [id number] OT Time Calculation (min): 20 min Charges:  OT General Charges $OT Visit: 1 Visit OT Evaluation $OT Eval Moderate Complexity: [address]  Jhovanny Guinta, OTR/L [phone]  Hope BuddsJones, Rhianne Soman Anne 04/16/2018, 11:38 AM

## 2018-04-16 NOTE — Care Management CC44 (Signed)
Condition Code 44 Documentation Completed  Patient Details  Name: ADIRA TRAUTWEIN MRN: [id number] Date of Birth: 05/11/1931   Condition Code 44 given:  Yes Patient signature on Condition Code 44 notice:  Yes Documentation of 2 MD's agreement:  Yes Code 44 added to claim:  Yes    Bartholome Bill, RN 04/16/2018, 9:31 AM

## 2018-04-16 NOTE — Progress Notes (Signed)
Initial Nutrition Assessment  INTERVENTION:   -Provide Ensure Enlive po BID, each supplement provides 350 kcal and 20 grams of protein -Encourage PO intake  NUTRITION DIAGNOSIS:   Inadequate oral intake related to (depression) as evidenced by per patient/family report.  GOAL:   Patient will meet greater than or equal to 90% of their needs  MONITOR:   PO intake, Supplement acceptance, Labs, Weight trends, I & O's  REASON FOR ASSESSMENT:   Malnutrition Screening Tool    ASSESSMENT:   85 y.o. female with hx of HTN, hypothyroidism, history of left hip partial arthroplasty and right knee surgery presented to the hospital with complaints of suicidal ideation and attempt with Tylenol ingestion. Patient reportedly was diagnosed with chronic depression several years ago by her provider, but no known suicide attempts.   Patient in room with granddaughter at bedside. Pt's granddaughter called pt's son and RD spoke to him over the phone. Per son, pt has eaten very poorly for ~2 weeks now and everyday states her PO intake decreased to where 3-4 days PTA she was refusing to eat at all. Pt was drinking fluids, and drinking at least 1 Ensure daily. Pt is willing to try and drink 2 Ensures now. Pt ate much better yesterday, PO intakes documented at 80-100% of meals. Pt states she still doesn't eat that much. Pt ate a few bites of a pancake and drank some juice.   Per pt's son, pt's UBW is 165 lb. Pt's weight has increased. Not sure if this is fluid related.  Medications: Remeron tablet daily Labs reviewed: Low Na   NUTRITION - FOCUSED PHYSICAL EXAM:    Most Recent Value  Orbital Region  Severe depletion  Upper Arm Region  No depletion  Thoracic and Lumbar Region  Unable to assess  Buccal Region  Mild depletion  Temple Region  Moderate depletion  Clavicle Bone Region  No depletion  Clavicle and Acromion Bone Region  No depletion  Scapular Bone Region  No depletion  Dorsal Hand  No  depletion  Patellar Region  Unable to assess  Anterior Thigh Region  Unable to assess  Posterior Calf Region  Unable to assess  Edema (RD Assessment)  None       Diet Order:   Diet Order            Diet regular Room service appropriate? Yes; Fluid consistency: Thin; Fluid restriction: 1800 mL Fluid  Diet effective now              EDUCATION NEEDS:   Education needs have been addressed  Skin:  Skin Assessment: Reviewed RN Assessment  Last BM:  1/27  Height:   Ht Readings from Last 1 Encounters:  04/15/18 5\' 8"  (1.727 m)    Weight:   Wt Readings from Last 1 Encounters:  04/15/18 80 kg    Ideal Body Weight:  63.6 kg  BMI:  Body mass index is 26.82 kg/m.  Estimated Nutritional Needs:   Kcal:  1800-2000  Protein:  85-95g  Fluid:  1.8L/day   Tilda FrancoLindsey Sharlize Hoar, MS, RD, LDN [facility] Clinical Dietitian Pager: [phone] After Hours Pager: [phone]

## 2018-04-17 DIAGNOSIS — F329 Major depressive disorder, single episode, unspecified: Secondary | ICD-10-CM

## 2018-04-17 DIAGNOSIS — F332 Major depressive disorder, recurrent severe without psychotic features: Secondary | ICD-10-CM | POA: Diagnosis not present

## 2018-04-17 DIAGNOSIS — R45851 Suicidal ideations: Secondary | ICD-10-CM | POA: Diagnosis not present

## 2018-04-17 DIAGNOSIS — R627 Adult failure to thrive: Secondary | ICD-10-CM | POA: Diagnosis not present

## 2018-04-17 LAB — CBC WITH DIFFERENTIAL/PLATELET
ABS IMMATURE GRANULOCYTES: 0.05 10*3/uL (ref 0.00–0.07)
Basophils Absolute: 0 10*3/uL (ref 0.0–0.1)
Basophils Relative: 0 %
Eosinophils Absolute: 0.1 10*3/uL (ref 0.0–0.5)
Eosinophils Relative: 1 %
HCT: 31.8 % — ABNORMAL LOW (ref 36.0–46.0)
Hemoglobin: 10.1 g/dL — ABNORMAL LOW (ref 12.0–15.0)
Immature Granulocytes: 1 %
Lymphocytes Relative: 17 %
Lymphs Abs: 1.5 10*3/uL (ref 0.7–4.0)
MCH: 28.9 pg (ref 26.0–34.0)
MCHC: 31.8 g/dL (ref 30.0–36.0)
MCV: 91.1 fL (ref 80.0–100.0)
Monocytes Absolute: 1.2 10*3/uL — ABNORMAL HIGH (ref 0.1–1.0)
Monocytes Relative: 13 %
Neutro Abs: 6 10*3/uL (ref 1.7–7.7)
Neutrophils Relative %: 68 %
PLATELETS: 278 10*3/uL (ref 150–400)
RBC: 3.49 MIL/uL — ABNORMAL LOW (ref 3.87–5.11)
RDW: 14.4 % (ref 11.5–15.5)
WBC: 8.9 10*3/uL (ref 4.0–10.5)
nRBC: 0 % (ref 0.0–0.2)

## 2018-04-17 LAB — BASIC METABOLIC PANEL
Anion gap: 8 (ref 5–15)
BUN: 17 mg/dL (ref 8–23)
CO2: 25 mmol/L (ref 22–32)
Calcium: 8.9 mg/dL (ref 8.9–10.3)
Chloride: 94 mmol/L — ABNORMAL LOW (ref 98–111)
Creatinine, Ser: 0.62 mg/dL (ref 0.44–1.00)
GFR calc Af Amer: >60 mL/min (ref >60)
GLUCOSE: 99 mg/dL (ref 70–99)
Potassium: 3.9 mmol/L (ref 3.5–5.1)
Sodium: 127 mmol/L — ABNORMAL LOW (ref 135–145)

## 2018-04-17 LAB — URINE CULTURE
Culture: 80000 — AB
Special Requests: NORMAL

## 2018-04-17 LAB — MAGNESIUM: Magnesium: 1.9 mg/dL (ref 1.7–2.4)

## 2018-04-17 MED ORDER — CEPHALEXIN 250 MG PO CAPS
250.0000 mg | ORAL_CAPSULE | Freq: Three times a day (TID) | ORAL | Status: DC
  Administered 2018-04-17 – 2018-04-19 (×8): 250 mg via ORAL
  Filled 2018-04-17 (×8): qty 1

## 2018-04-17 MED ORDER — AMLODIPINE BESYLATE 5 MG PO TABS
5.0000 mg | ORAL_TABLET | Freq: Every day | ORAL | Status: DC
  Administered 2018-04-17 – 2018-04-18 (×2): 5 mg via ORAL
  Filled 2018-04-17 (×2): qty 1

## 2018-04-17 NOTE — Progress Notes (Signed)
Physical Therapy Treatment Patient Details Name: Tina Wheeler MRN: [id number] DOB: 06-03-1931 Today's Date: 04/17/2018    History of Present Illness 85 yo female with PMH of  dementia,hypothyroidism ,hypertension, right leg tibial plateau and fibular fracture, varicose veins , bilateral Hip fractures, admitted 04/14/18 for evaluation of 3 weeks of increased weakness, falls,has been refusing to eat and is not walking anymore , also for  possible  overdose of tylenol, FTT.Marland Kitchen Healing right 5th MT fracture    PT Comments    The patient continue  To require extensive assistance for mobility. Unable to safely pivot to [facility] today. Audible crepitus  Of knees. Continue  PT.   Follow Up Recommendations  SNF     Equipment Recommendations  None recommended by PT    Recommendations for Other Services       Precautions / Restrictions Precautions Precautions: Fall Precaution Comments: aircast on right ankle    Mobility  Bed Mobility Overal bed mobility: Needs Assistance Bed Mobility: Supine to Sit;Sit to Supine     Supine to sit: Max assist;HOB elevated Sit to supine: Max assist   General bed mobility comments: assist with legs and trunk , use of bed pad to scoot to bed edge and back into bed.   Transfers Overall transfer level: Needs assistance Equipment used: Rolling walker (2 wheeled) Transfers: Sit to/from Stand Sit to Stand: Max assist;+2 physical assistance;+2 safety/equipment         General transfer comment: Pt requires heavy outside assist to stand. Attempted to pivot to [facility], patient unable to stand  to pivot.  Ambulation/Gait                 Stairs             Wheelchair Mobility    Modified Rankin (Stroke Patients Only)       Balance                                            Cognition Arousal/Alertness: Awake/alert Behavior During Therapy: Flat affect Overall Cognitive Status: No family/caregiver present to determine  baseline cognitive functioning                                 General Comments: not oriented to place or date.  Could not recall city she lives in      Exercises      General Comments        Pertinent Vitals/Pain Pain Assessment: No/denies pain    Home Living                      Prior Function            PT Goals (current goals can now be found in the care plan section) Progress towards PT goals: Progressing toward goals    Frequency    Min 2X/week      PT Plan Current plan remains appropriate    Co-evaluation              AM-PAC PT "6 Clicks" Mobility   Outcome Measure  Help needed turning from your back to your side while in a flat bed without using bedrails?: Total Help needed moving from lying on your back to sitting on the side of a flat bed without using bedrails?:  Total Help needed moving to and from a bed to a chair (including a wheelchair)?: Total Help needed standing up from a chair using your arms (e.g., wheelchair or bedside chair)?: Total Help needed to walk in hospital room?: Total Help needed climbing 3-5 steps with a railing? : Total 6 Click Score: 6    End of Session Equipment Utilized During Treatment: Gait belt Activity Tolerance: Patient tolerated treatment well Patient left: in bed;with call bell/phone within reach;with bed alarm set Nurse Communication: Mobility status PT Visit Diagnosis: Unsteadiness on feet (R26.81)     Time: 1443-1540 PT Time Calculation (min) (ACUTE ONLY): 24 min  Charges:  $Self Care/Home Management: 23-37                     Blanchard Kelch PT Acute Rehabilitation Services Pager [phone] Office [phone]    Rada Hay 04/17/2018, 5:15 PM

## 2018-04-17 NOTE — Progress Notes (Signed)
[facility]                                                                              Patient Demographics  Tina Wheeler, is a 85 y.o. female, DOB - Jun 21, 1931, LNL:[id number]  Admit date - 04/14/2018   Admitting Physician Tina Bene, MD  Outpatient Primary MD for the patient is Tina Wheeler, Tina Quill, MD  Outpatient specialists:   LOS - 2  days   Medical records reviewed and are as summarized below:    Chief Complaint  Patient presents with  . Weakness  . Depression       Brief summary   Tina L Combsis a 85 y.o.femalewithhx of HTN, hypothyroidism, history of left hip partial arthroplasty and right knee surgery presented to the hospital with complaints of suicidal ideation and attempt with Tylenol ingestion. Patientreportedly was diagnosed with chronic depression several years ago by her provider, but no known suicide attempts. She was raised in foster homes and was separated from her biological sister nearly 40 years ago. Family denies alcohol abuse or other substance abuse. Since her hip fracture and surgery last year, family reports that patient has been increasingly avoiding social interaction and has been expressing guilt that she is a "burden" to her family. This past month, she has been refusing to eat "barely anything" and has been stating that she wanted to "die.  Family also noted chronic intermittent increased urinary frequency (baseline urinary incontinence) but otherwise has not noted any fevers/chills or malodorous urine. Patient did have a fall where she injured her R foot, but denies head trauma.     Assessment & Plan    Principal Problem:   MDD (major depressive disorder), recurrent severe, without psychosis (HCC) with suicidal attempt with Tylenol overdose -Patient was seen by psychiatry, deemed low risk for hurting herself at this time -Sitter was discontinued, started on Remeron -Urine drug screen was negative for alcohol or Tylenol on  admission  Active Problems: Failure to thrive -PT OT evaluation recommended skilled nursing facility -Dietitian has been consulted  E. coli UTI -Urine culture showed 80,000 colonies of E. coli, Ceftin, transition to oral Keflex today   Hypothyroidism -TSH 5.8, free T4 1.3, continue Synthroid -Follow-up TSH in 4 weeks.  Essential hypertension -BP elevated, continue lisinopril, added Norvasc 5 mg daily  Chronic hyponatremia -Likely from SIADH, urine osmolality 434  Chronic multifactorial anemia Hold off with iron supplements due to poor oral intake  Mild bilateral lower extremity edema Chronic  Recent fall with right foot bruising Continue splint, pain control, PT     Code Status: dnr DVT Prophylaxis:  Lovenox  Family Communication: Discussed in detail with the patient, all imaging results, lab results explained to the patient    Disposition Plan: Awaiting skilled nursing facility, lives at home alone PT if patient's family can assume care otherwise PT OT recommending skilled nursing facility  Time Spent in minutes 25 minutes  Procedures:  None  Consultants:   Psychiatry  Antimicrobials:   Anti-infectives (From admission, onward)   Start     Dose/Rate Route Frequency Ordered Stop   04/17/18 1600  cephALEXin (KEFLEX)  capsule 250 mg     250 mg Oral 3 times daily 04/17/18 1143     04/14/18 2215  cefTRIAXone (ROCEPHIN) 1 g in sodium chloride 0.9 % 100 mL IVPB  Status:  Discontinued     1 g 200 mL/hr over 30 Minutes Intravenous Daily at bedtime 04/14/18 2144 04/17/18 1143   04/14/18 2145  cefTRIAXone (ROCEPHIN) 1 g in sodium chloride 0.9 % 100 mL IVPB  Status:  Discontinued     1 g 200 mL/hr over 30 Minutes Intravenous  Once 04/14/18 2135 04/14/18 2144          Medications  Scheduled Meds: . cephALEXin  250 mg Oral TID  . enoxaparin (LOVENOX) injection  40 mg Subcutaneous Q24H  . feeding supplement (ENSURE ENLIVE)  237 mL Oral BID BM  .  levothyroxine  100 mcg Oral QAC breakfast  . lisinopril  20 mg Oral Daily  . mirtazapine  7.5 mg Oral QHS  . prednisoLONE acetate  1 drop Right Eye BID   Continuous Infusions: . sodium chloride 10 mL/hr at 04/17/18 0832   PRN Meds:.sodium chloride, acetaminophen **OR** acetaminophen, guaiFENesin-dextromethorphan, senna-docusate      Subjective:   Tina Wheeler was seen and examined today.  No complaints per patient. Patient denies dizziness, chest pain, shortness of breath, abdominal pain, N/V/D/C, new weakness, numbess, tingling. No acute events overnight.    Objective:   Vitals:   04/16/18 1222 04/16/18 2100 04/17/18 0600 04/17/18 1344  BP: 140/63 140/60 (!) 171/88 (!) 173/74  Pulse: 70 90 97 (!) 102  Resp: 16 16 16 18   Temp: 98 F (36.7 C) 97.8 F (36.6 C) 98 F (36.7 C) 99.2 F (37.3 C)  TempSrc: Oral Oral Oral Oral  SpO2: 95% 95% 95% 97%  Weight:      Height:        Intake/Output Summary (Last 24 hours) at 04/17/2018 1357 Last data filed at 04/17/2018 1350 Gross per 24 hour  Intake 928.53 ml  Output 800 ml  Net 128.53 ml     Wt Readings from Last 3 Encounters:  04/15/18 80 kg  02/06/17 85.8 kg  04/26/12 82.1 kg     Exam  General: Alert and oriented x self and place  Eyes:   HEENT:  Atraumatic, normocephalic  Cardiovascular: S1 S2 auscultated, no rubs, murmurs or gallops. Regular rate and rhythm.  Respiratory: Clear to auscultation bilaterally, no wheezing, rales or rhonchi  Gastrointestinal: Soft, nontender, nondistended, + bowel sounds  Ext: no pedal edema bilaterally, right leg in splint  Neuro: No new neuro deficits  Musculoskeletal: No digital cyanosis, clubbing  Skin: No rashes  Psych: Oriented to self and place   Data Reviewed:  I have personally reviewed following labs and imaging studies  Micro Results Recent Results (from the past 240 hour(s))  Urine culture     Status: Abnormal   Collection Time: 04/14/18  9:36 PM  Result  Value Ref Range Status   Specimen Description   Final    URINE, RANDOM Performed at [facility], [address] [place], [place] [address]    Special Requests   Final    Normal Performed at [facility], [address] [place], [place] [address]    Culture 80,000 COLONIES/mL ESCHERICHIA COLI (A)  Final   Report Status 04/17/2018 FINAL  Final   Organism ID, Bacteria ESCHERICHIA COLI (A)  Final      Susceptibility   Escherichia coli - MIC*  AMPICILLIN >=32 RESISTANT Resistant     CEFAZOLIN <=4 SENSITIVE Sensitive     CEFTRIAXONE <=1 SENSITIVE Sensitive     CIPROFLOXACIN <=0.25 SENSITIVE Sensitive     GENTAMICIN <=1 SENSITIVE Sensitive     IMIPENEM <=0.25 SENSITIVE Sensitive     NITROFURANTOIN <=16 SENSITIVE Sensitive     TRIMETH/SULFA <=20 SENSITIVE Sensitive     AMPICILLIN/SULBACTAM >=32 RESISTANT Resistant     PIP/TAZO <=4 SENSITIVE Sensitive     Extended ESBL NEGATIVE Sensitive     * 80,000 COLONIES/mL ESCHERICHIA COLI    Radiology Reports Dg Chest 1 View  Result Date: 04/14/2018 CLINICAL DATA:  Weakness EXAM: CHEST  1 VIEW COMPARISON:  02/02/2017 FINDINGS: Cardiac shadows within normal limits. Aortic calcifications are noted. The lungs are well aerated bilaterally. No focal infiltrate or sizable effusion is seen. No bony abnormality is noted. IMPRESSION: No acute abnormality seen. Electronically Signed   By: Alcide CleverMark  Lukens M.D.   On: 04/14/2018 17:11   Ct Head Wo Contrast  Result Date: 04/14/2018 CLINICAL DATA:  Weakness EXAM: CT HEAD WITHOUT CONTRAST TECHNIQUE: Contiguous axial images were obtained from the base of the skull through the vertex without intravenous contrast. COMPARISON:  None. FINDINGS: Brain: Atrophic and chronic white matter ischemic changes are noted. No findings to suggest acute hemorrhage, acute infarction or space-occupying mass lesion are noted. Vascular: No hyperdense vessel or unexpected calcification. Skull:  Normal. Negative for fracture or focal lesion. Sinuses/Orbits: No acute finding. Other: None. IMPRESSION: Chronic atrophic and ischemic changes without acute abnormality. Electronically Signed   By: Alcide CleverMark  Lukens M.D.   On: 04/14/2018 17:05   Dg Foot Complete Right  Result Date: 04/14/2018 CLINICAL DATA:  Pain and bruising across the foot, initial encounter EXAM: RIGHT FOOT COMPLETE - 3+ VIEW COMPARISON:  None. FINDINGS: Generalized osteopenia is noted. There are changes consistent with a healing fracture in the distal aspect of the third metatarsal. No other fracture is seen. No soft tissue abnormality is noted. IMPRESSION: Healing third metatarsal fracture distally. No other focal abnormality is noted. Electronically Signed   By: Alcide CleverMark  Lukens M.D.   On: 04/14/2018 21:23    Lab Data:  CBC: Recent Labs  Lab 04/14/18 1524 04/15/18 0443 04/16/18 0503 04/17/18 0323  WBC 8.1 8.1 9.0 8.9  NEUTROABS  --  5.8 6.0 6.0  HGB 11.5* 10.2* 10.4* 10.1*  HCT 34.4* 31.5* 32.4* 31.8*  MCV 88.9 90.0 88.5 91.1  PLT 333 262 282 278   Basic Metabolic Panel: Recent Labs  Lab 04/14/18 1524 04/15/18 0443 04/16/18 0503 04/17/18 0323  NA 125* 126* 126*  127* 127*  K 4.4 4.1 3.9  4.0 3.9  CL 92* 95* 92*  92* 94*  CO2 24 24 27  27 25   GLUCOSE 117* 106* 117*  118* 99  BUN 14 11 14  14 17   CREATININE 0.68 0.54 0.63  0.67 0.62  CALCIUM 9.3 8.9 9.3  9.4 8.9  MG  --  1.9 2.0 1.9  PHOS  --   --  3.1  --    GFR: Estimated Creatinine Clearance: 58.1 mL/min (by C-G formula based on SCr of 0.62 mg/dL). Liver Function Tests: Recent Labs  Lab 04/14/18 1524 04/16/18 0503  AST 43*  --   ALT 22  --   ALKPHOS 124  --   BILITOT 0.6  --   PROT 5.5*  --   ALBUMIN 2.8* 2.5*   Recent Labs  Lab 04/14/18 1604  LIPASE 35   No results  for input(s): AMMONIA in the last 168 hours. Coagulation Profile: No results for input(s): INR, PROTIME in the last 168 hours. Cardiac Enzymes: Recent Labs  Lab  04/14/18 1604  TROPONINI <0.03   BNP (last 3 results) No results for input(s): PROBNP in the last 8760 hours. HbA1C: No results for input(s): HGBA1C in the last 72 hours. CBG: No results for input(s): GLUCAP in the last 168 hours. Lipid Profile: No results for input(s): CHOL, HDL, LDLCALC, TRIG, CHOLHDL, LDLDIRECT in the last 72 hours. Thyroid Function Tests: Recent Labs    04/14/18 1604 04/15/18 0443  TSH 5.844*  --   FREET4  --  1.36   Anemia Panel: No results for input(s): VITAMINB12, FOLATE, FERRITIN, TIBC, IRON, RETICCTPCT in the last 72 hours. Urine analysis:    Component Value Date/Time   COLORURINE YELLOW 04/14/2018 1557   APPEARANCEUR CLOUDY (A) 04/14/2018 1557   LABSPEC 1.014 04/14/2018 1557   PHURINE 6.0 04/14/2018 1557   GLUCOSEU NEGATIVE 04/14/2018 1557   HGBUR NEGATIVE 04/14/2018 1557   BILIRUBINUR NEGATIVE 04/14/2018 1557   KETONESUR 5 (A) 04/14/2018 1557   PROTEINUR NEGATIVE 04/14/2018 1557   UROBILINOGEN 0.2 07/24/2007 0357   NITRITE NEGATIVE 04/14/2018 1557   LEUKOCYTESUR LARGE (A) 04/14/2018 1557     Jacee Enerson M.D. [facility] 04/17/2018, 1:57 PM  Pager: [phone] Between 7am to 7pm - call Pager - [phone]  After 7pm go to [email or web address] - password [email or web address]  Call night coverage person covering after 7pm

## 2018-04-17 NOTE — Clinical Social Work Note (Signed)
Clinical Social Work Assessment  Patient Details  Name: Tina Wheeler MRN: [id number] Date of Birth: [birth year]  Date of referral:  04/16/18               Reason for consult:  Mental Health Concerns, Facility Placement                Permission sought to share information with:  Family Supports Permission granted to share information::  Yes, Verbal Permission Granted  Name::     daughter Steward Drone  Agency::     Relationship::     Contact Information:     Housing/Transportation Living arrangements for the past 2 months:  Single Family Home Source of Information:  Patient, Adult Children, Medical Team Patient Interpreter Needed:  None Criminal Activity/Legal Involvement Pertinent to Current Situation/Hospitalization:  No - Comment as needed Significant Relationships:  Adult Children Lives with:  Adult Children Do you feel safe going back to the place where you live?  Yes Need for family participation in patient care:  Yes (Comment)(daughter)  Care giving concerns:  Pt admitted from home where she resides with her daughter and her family. Ingested tylenol in apparent attempt at suicide/self harm.  Daughter reports over the past few weeks pt has stopped eating much or getting out of bed. Reports pt is losing her eyesight due to macular degeneration and is now unable to do the activities she enjoys such as reading and puzzles. Daughter feels this has lead to pt's depressed mood. Pt at baseline uses a walker to ambulate- needs assistance from bed to walker. Daughter assists her with her ADLs.  Social Worker assessment / plan:  CSW consulted due to pt admitting for overdose attempt. Reviewed chart- psychiatry has consulted as well and pt was started on antidepressant, deemed to not be in need of inpatient psychiatric treatment. Denies current SI and reports remorse for ingesting tylenol. Daughter participated via phone and reports that she and pt's son-in-law now have all medications stored where  pt will not have access.  Pt was drowsy during interaction and CSW continued discussing disposition with daughter when pt slept. Daughter reports pt has seemed depressed during periods in the past as well but that she has never had formal treatment that she knows of. Feels trigger for current episode are the social/health factors listed above. Daughter reports pt seems deconditioned compared to her baseline (see above) when working with therapy here and they are in agreement to pursue SNF. Pt has been to [facility] for rehab in the past therefore familiar with process.  CSW made referrals in [place] area at daughters request, however discussion was had that due to pt's documented very recent suicidal ideation/attempt, SNF placement will be challenging if at all possible. Daughter understanding.    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 Seemed depressed and withdrawn, although drowsiness also likely contributing to affect. Daughter showed good understanding of current plan and barriers. Seemed highly supportive of pt  Emotional Assessment Appearance:  Appears stated age Attitude/Demeanor/Rapport:  (drowsy) Affect (typically observed):  Calm, Depressed Orientation:  Oriented to Self, Oriented to Place, Oriented to  Time, Oriented to Situation Alcohol /  Substance use:  Not Applicable Psych involvement (Current and /or in the community):  No (Comment)  Discharge Needs  Concerns to be addressed:  Mental Health Concerns, Discharge Planning Concerns Readmission within  the last 30 days:  No Current discharge risk:  Dependent with Mobility Barriers to Discharge:  No SNF bed   Nelwyn Salisbury, LCSW 04/17/2018, 9:51 AM [phone]

## 2018-04-18 DIAGNOSIS — F332 Major depressive disorder, recurrent severe without psychotic features: Secondary | ICD-10-CM | POA: Diagnosis not present

## 2018-04-18 DIAGNOSIS — R627 Adult failure to thrive: Secondary | ICD-10-CM | POA: Diagnosis not present

## 2018-04-18 DIAGNOSIS — R45851 Suicidal ideations: Secondary | ICD-10-CM | POA: Diagnosis not present

## 2018-04-18 DIAGNOSIS — F329 Major depressive disorder, single episode, unspecified: Secondary | ICD-10-CM | POA: Diagnosis not present

## 2018-04-18 LAB — CBC WITH DIFFERENTIAL/PLATELET
Abs Immature Granulocytes: 0.03 10*3/uL (ref 0.00–0.07)
BASOS PCT: 0 %
Basophils Absolute: 0 10*3/uL (ref 0.0–0.1)
Eosinophils Absolute: 0.3 10*3/uL (ref 0.0–0.5)
Eosinophils Relative: 2 %
HCT: 32.7 % — ABNORMAL LOW (ref 36.0–46.0)
Hemoglobin: 10.6 g/dL — ABNORMAL LOW (ref 12.0–15.0)
Immature Granulocytes: 0 %
Lymphocytes Relative: 21 %
Lymphs Abs: 2.3 10*3/uL (ref 0.7–4.0)
MCH: 28.9 pg (ref 26.0–34.0)
MCHC: 32.4 g/dL (ref 30.0–36.0)
MCV: 89.1 fL (ref 80.0–100.0)
Monocytes Absolute: 1.4 10*3/uL — ABNORMAL HIGH (ref 0.1–1.0)
Monocytes Relative: 12 %
Neutro Abs: 7 10*3/uL (ref 1.7–7.7)
Neutrophils Relative %: 65 %
Platelets: 315 10*3/uL (ref 150–400)
RBC: 3.67 MIL/uL — ABNORMAL LOW (ref 3.87–5.11)
RDW: 14.3 % (ref 11.5–15.5)
WBC: 11 10*3/uL — ABNORMAL HIGH (ref 4.0–10.5)
nRBC: 0 % (ref 0.0–0.2)

## 2018-04-18 LAB — BASIC METABOLIC PANEL
Anion gap: 7 (ref 5–15)
BUN: 23 mg/dL (ref 8–23)
CHLORIDE: 94 mmol/L — AB (ref 98–111)
CO2: 26 mmol/L (ref 22–32)
Calcium: 9.1 mg/dL (ref 8.9–10.3)
Creatinine, Ser: 0.66 mg/dL (ref 0.44–1.00)
GFR calc Af Amer: >60 mL/min (ref >60)
GFR calc non Af Amer: >60 mL/min (ref >60)
Glucose, Bld: 110 mg/dL — ABNORMAL HIGH (ref 70–99)
Potassium: 4.2 mmol/L (ref 3.5–5.1)
Sodium: 127 mmol/L — ABNORMAL LOW (ref 135–145)

## 2018-04-18 LAB — MAGNESIUM: Magnesium: 2.1 mg/dL (ref 1.7–2.4)

## 2018-04-18 MED ORDER — AMLODIPINE BESYLATE 10 MG PO TABS
10.0000 mg | ORAL_TABLET | Freq: Every day | ORAL | Status: DC
  Administered 2018-04-19: 10 mg via ORAL
  Filled 2018-04-18: qty 1

## 2018-04-18 MED ORDER — HYDRALAZINE HCL 20 MG/ML IJ SOLN
10.0000 mg | Freq: Four times a day (QID) | INTRAMUSCULAR | Status: DC | PRN
  Administered 2018-04-18: 10 mg via INTRAVENOUS
  Filled 2018-04-18: qty 1

## 2018-04-18 NOTE — Progress Notes (Signed)
[facility]                                                                              Patient Demographics  Tina Wheeler, is a 85 y.o. female, DOB - [birth year], XEN:[id number]  Admit date - 04/14/2018   Admitting Physician Ike Bene, MD  Outpatient Primary MD for the patient is Madolyn Frieze, Etta Quill, MD  Outpatient specialists:   LOS - 2  days   Medical records reviewed and are as summarized below:    Chief Complaint  Patient presents with  . Weakness  . Depression       Brief summary   Tina L Combsis a 85 y.o.femalewithhx of HTN, hypothyroidism, history of left hip partial arthroplasty and right knee surgery presented to the hospital with complaints of suicidal ideation and attempt with Tylenol ingestion. Patientreportedly was diagnosed with chronic depression several years ago by her provider, but no known suicide attempts. She was raised in foster homes and was separated from her biological sister nearly 40 years ago. Family denies alcohol abuse or other substance abuse. Since her hip fracture and surgery last year, family reports that patient has been increasingly avoiding social interaction and has been expressing guilt that she is a "burden" to her family. This past month, she has been refusing to eat "barely anything" and has been stating that she wanted to "die.  Family also noted chronic intermittent increased urinary frequency (baseline urinary incontinence) but otherwise has not noted any fevers/chills or malodorous urine. Patient did have a fall where she injured her R foot, but denies head trauma.     Assessment & Plan    Principal Problem:   MDD (major depressive disorder), recurrent severe, without psychosis (HCC) with suicidal attempt with Tylenol overdose -Patient was seen by psychiatry, deemed low risk for hurting herself at this time -Sitter was discontinued, started on Remeron -Urine drug screen was negative for alcohol or Tylenol on  admission -No acute issues or complaints, awaiting skilled nursing facility  Active Problems: Failure to thrive -PT OT evaluation recommended skilled nursing facility -Dietitian has been consulted  E. coli UTI -Urine culture showed 80,000 colonies of E. coli, Ceftin, transition to oral Keflex today   Hypothyroidism -TSH 5.8, free T4 1.3, continue Synthroid -Follow-up TSH in 4 weeks.  Essential hypertension -BP still elevated, increase Norvasc to 10 mg daily, continue lisinopril  -Added hydralazine IV as needed with parameters  Chronic hyponatremia -Likely from SIADH, urine osmolality 434  Chronic multifactorial anemia Hold off with iron supplements due to poor oral intake  Mild bilateral lower extremity edema Chronic  Recent fall with right foot bruising Continue splint, pain control, PT     Code Status: dnr DVT Prophylaxis:  Lovenox  Family Communication: Discussed in detail with the patient, all imaging results, lab results explained to the patient    Disposition Plan: Awaiting skilled nursing facility, lives at home alone PT if patient's family can assume care otherwise PT OT recommending skilled nursing facility  Time Spent in minutes 25 minutes  Procedures:  None  Consultants:   Psychiatry  Antimicrobials:   Anti-infectives (From admission, onward)  Start     Dose/Rate Route Frequency Ordered Stop   04/17/18 1600  cephALEXin (KEFLEX) capsule 250 mg     250 mg Oral 3 times daily 04/17/18 1143     04/14/18 2215  cefTRIAXone (ROCEPHIN) 1 g in sodium chloride 0.9 % 100 mL IVPB  Status:  Discontinued     1 g 200 mL/hr over 30 Minutes Intravenous Daily at bedtime 04/14/18 2144 04/17/18 1143   04/14/18 2145  cefTRIAXone (ROCEPHIN) 1 g in sodium chloride 0.9 % 100 mL IVPB  Status:  Discontinued     1 g 200 mL/hr over 30 Minutes Intravenous  Once 04/14/18 2135 04/14/18 2144         Medications  Scheduled Meds: . amLODipine  5 mg Oral Daily  .  cephALEXin  250 mg Oral TID  . enoxaparin (LOVENOX) injection  40 mg Subcutaneous Q24H  . feeding supplement (ENSURE ENLIVE)  237 mL Oral BID BM  . levothyroxine  100 mcg Oral QAC breakfast  . lisinopril  20 mg Oral Daily  . mirtazapine  7.5 mg Oral QHS  . prednisoLONE acetate  1 drop Right Eye BID   Continuous Infusions: . sodium chloride Stopped (04/18/18 0211)   PRN Meds:.sodium chloride, acetaminophen **OR** acetaminophen, guaiFENesin-dextromethorphan, senna-docusate      Subjective:   Tina Wheeler was seen and examined today.  No complaints.  BP still elevated.  Patient denies dizziness, chest pain, shortness of breath, abdominal pain, N/V/D/C, new weakness, numbess, tingling. No acute events overnight.    Objective:   Vitals:   04/17/18 1344 04/17/18 2142 04/18/18 0458 04/18/18 1250  BP: (!) 173/74 (!) 155/66 (!) 159/70 (!) 182/71  Pulse: (!) 102 (!) 102 (!) 109 92  Resp: 18 20 18 18   Temp: 99.2 F (37.3 C) 98.9 F (37.2 C) 98.2 F (36.8 C) 98.8 F (37.1 C)  TempSrc: Oral Oral Oral Oral  SpO2: 97% 94% 93% 95%  Weight:      Height:        Intake/Output Summary (Last 24 hours) at 04/18/2018 1337 Last data filed at 04/18/2018 1100 Gross per 24 hour  Intake 1755.25 ml  Output 600 ml  Net 1155.25 ml     Wt Readings from Last 3 Encounters:  04/15/18 80 kg  02/06/17 85.8 kg  04/26/12 82.1 kg   Physical Exam  General: Alert and oriented x 3, NAD  Eyes:   HEENT:    Cardiovascular: S1 S2 clear,  No pedal edema b/l  Respiratory: CTAB, no wheezing, rales or rhonchi  Gastrointestinal: Soft, nontender, nondistended, NBS  Ext: RLE in splint  Neuro: no new deficits  Musculoskeletal: No cyanosis, clubbing  Skin: No rashes  Psych: Flat affect   Data Reviewed:  I have personally reviewed following labs and imaging studies  Micro Results Recent Results (from the past 240 hour(s))  Urine culture     Status: Abnormal   Collection Time: 04/14/18  9:36 PM    Result Value Ref Range Status   Specimen Description   Final    URINE, RANDOM Performed at [facility], [address], [place], [place] [address]    Special Requests   Final    Normal Performed at [facility], [address], [place], [place] [address]    Culture 80,000 COLONIES/mL ESCHERICHIA COLI (A)  Final   Report Status 04/17/2018 FINAL  Final   Organism ID, Bacteria ESCHERICHIA COLI (A)  Final      Susceptibility  Escherichia coli - MIC*    AMPICILLIN >=32 RESISTANT Resistant     CEFAZOLIN <=4 SENSITIVE Sensitive     CEFTRIAXONE <=1 SENSITIVE Sensitive     CIPROFLOXACIN <=0.25 SENSITIVE Sensitive     GENTAMICIN <=1 SENSITIVE Sensitive     IMIPENEM <=0.25 SENSITIVE Sensitive     NITROFURANTOIN <=16 SENSITIVE Sensitive     TRIMETH/SULFA <=20 SENSITIVE Sensitive     AMPICILLIN/SULBACTAM >=32 RESISTANT Resistant     PIP/TAZO <=4 SENSITIVE Sensitive     Extended ESBL NEGATIVE Sensitive     * 80,000 COLONIES/mL ESCHERICHIA COLI    Radiology Reports Dg Chest 1 View  Result Date: 04/14/2018 CLINICAL DATA:  Weakness EXAM: CHEST  1 VIEW COMPARISON:  02/02/2017 FINDINGS: Cardiac shadows within normal limits. Aortic calcifications are noted. The lungs are well aerated bilaterally. No focal infiltrate or sizable effusion is seen. No bony abnormality is noted. IMPRESSION: No acute abnormality seen. Electronically Signed   By: Alcide CleverMark  Lukens M.D.   On: 04/14/2018 17:11   Ct Head Wo Contrast  Result Date: 04/14/2018 CLINICAL DATA:  Weakness EXAM: CT HEAD WITHOUT CONTRAST TECHNIQUE: Contiguous axial images were obtained from the base of the skull through the vertex without intravenous contrast. COMPARISON:  None. FINDINGS: Brain: Atrophic and chronic white matter ischemic changes are noted. No findings to suggest acute hemorrhage, acute infarction or space-occupying mass lesion are noted. Vascular: No hyperdense vessel or unexpected calcification.  Skull: Normal. Negative for fracture or focal lesion. Sinuses/Orbits: No acute finding. Other: None. IMPRESSION: Chronic atrophic and ischemic changes without acute abnormality. Electronically Signed   By: Alcide CleverMark  Lukens M.D.   On: 04/14/2018 17:05   Dg Foot Complete Right  Result Date: 04/14/2018 CLINICAL DATA:  Pain and bruising across the foot, initial encounter EXAM: RIGHT FOOT COMPLETE - 3+ VIEW COMPARISON:  None. FINDINGS: Generalized osteopenia is noted. There are changes consistent with a healing fracture in the distal aspect of the third metatarsal. No other fracture is seen. No soft tissue abnormality is noted. IMPRESSION: Healing third metatarsal fracture distally. No other focal abnormality is noted. Electronically Signed   By: Alcide CleverMark  Lukens M.D.   On: 04/14/2018 21:23    Lab Data:  CBC: Recent Labs  Lab 04/14/18 1524 04/15/18 0443 04/16/18 0503 04/17/18 0323 04/18/18 0341  WBC 8.1 8.1 9.0 8.9 11.0*  NEUTROABS  --  5.8 6.0 6.0 7.0  HGB 11.5* 10.2* 10.4* 10.1* 10.6*  HCT 34.4* 31.5* 32.4* 31.8* 32.7*  MCV 88.9 90.0 88.5 91.1 89.1  PLT 333 262 282 278 315   Basic Metabolic Panel: Recent Labs  Lab 04/14/18 1524 04/15/18 0443 04/16/18 0503 04/17/18 0323 04/18/18 0341  NA 125* 126* 126*  127* 127* 127*  K 4.4 4.1 3.9  4.0 3.9 4.2  CL 92* 95* 92*  92* 94* 94*  CO2 24 24 27  27 25 26   GLUCOSE 117* 106* 117*  118* 99 110*  BUN 14 11 14  14 17 23   CREATININE 0.68 0.54 0.63  0.67 0.62 0.66  CALCIUM 9.3 8.9 9.3  9.4 8.9 9.1  MG  --  1.9 2.0 1.9 2.1  PHOS  --   --  3.1  --   --    GFR: Estimated Creatinine Clearance: 58.1 mL/min (by C-G formula based on SCr of 0.66 mg/dL). Liver Function Tests: Recent Labs  Lab 04/14/18 1524 04/16/18 0503  AST 43*  --   ALT 22  --   ALKPHOS 124  --   BILITOT  0.6  --   PROT 5.5*  --   ALBUMIN 2.8* 2.5*   Recent Labs  Lab 04/14/18 1604  LIPASE 35   No results for input(s): AMMONIA in the last 168 hours. Coagulation  Profile: No results for input(s): INR, PROTIME in the last 168 hours. Cardiac Enzymes: Recent Labs  Lab 04/14/18 1604  TROPONINI <0.03   BNP (last 3 results) No results for input(s): PROBNP in the last 8760 hours. HbA1C: No results for input(s): HGBA1C in the last 72 hours. CBG: No results for input(s): GLUCAP in the last 168 hours. Lipid Profile: No results for input(s): CHOL, HDL, LDLCALC, TRIG, CHOLHDL, LDLDIRECT in the last 72 hours. Thyroid Function Tests: No results for input(s): TSH, T4TOTAL, FREET4, T3FREE, THYROIDAB in the last 72 hours. Anemia Panel: No results for input(s): VITAMINB12, FOLATE, FERRITIN, TIBC, IRON, RETICCTPCT in the last 72 hours. Urine analysis:    Component Value Date/Time   COLORURINE YELLOW 04/14/2018 1557   APPEARANCEUR CLOUDY (A) 04/14/2018 1557   LABSPEC 1.014 04/14/2018 1557   PHURINE 6.0 04/14/2018 1557   GLUCOSEU NEGATIVE 04/14/2018 1557   HGBUR NEGATIVE 04/14/2018 1557   BILIRUBINUR NEGATIVE 04/14/2018 1557   KETONESUR 5 (A) 04/14/2018 1557   PROTEINUR NEGATIVE 04/14/2018 1557   UROBILINOGEN 0.2 07/24/2007 0357   NITRITE NEGATIVE 04/14/2018 1557   LEUKOCYTESUR LARGE (A) 04/14/2018 1557     Taedyn Glasscock M.D. [facility] 04/18/2018, 1:37 PM  Pager: [phone] Between 7am to 7pm - call Pager - [phone]  After 7pm go to [email or web address] - password [email or web address]  Call night coverage person covering after 7pm

## 2018-04-18 NOTE — Progress Notes (Signed)
Occupational Therapy Treatment Patient Details Name: Tina Wheeler MRN: [id number] DOB: 04/06/1931 Today's Date: 04/18/2018    History of present illness 85 yo female with PMH of  dementia,hypothyroidism ,hypertension, right leg tibial plateau and fibular fracture, varicose veins , bilateral Hip fractures, admitted 04/14/18 for evaluation of 3 weeks of increased weakness, falls,has been refusing to eat and is not walking anymore , also for  possible  overdose of tylenol, FTT.Marland Kitchen. Healing right 5th MT fracture   OT comments  Pt presents supine in bed pleasant and agreeable to therapy. Pt continues to demonstrate significant weakness and decreased activity tolerance. Pt requiring maxA for bed mobility and requiring minA to maintain sitting balance EOB during session completion. Pt engaged in UB/LB exercise and simple grooming ADL with setup assist. Continue to recommend SNF level therapy services at time of discharge to progress pt's safety and independence with ADL and mobility. Will continue to follow acutely.   Follow Up Recommendations  SNF;Supervision/Assistance - 24 hour    Equipment Recommendations  Other (comment)(TBD)          Precautions / Restrictions Precautions Precautions: Fall Precaution Comments: aircast on right ankle Restrictions Weight Bearing Restrictions: No       Mobility Bed Mobility Overal bed mobility: Needs Assistance Bed Mobility: Supine to Sit;Sit to Supine     Supine to sit: Max assist;HOB elevated Sit to supine: Max assist   General bed mobility comments: assist with legs and trunk , use of bed pad to scoot to bed edge and back into bed. [facility]  [facility]                      Balance Overall balance assessment: Needs assistance Sitting-balance support: Feet supported Sitting balance-Leahy Scale: Poor Sitting balance - Comments: pt requiring overall minA for sitting balance today                                    ADL either performed or assessed with clinical judgement   ADL Overall ADL's : Needs assistance/impaired     Grooming: Set up;Wash/dry face;Bed level                                 General ADL Comments: pt performing bed mobility and sat EOB with therapist assist; engaged in UB/LB exercise and grooming ADL during session     Cognition Arousal/Alertness: Awake/alert Behavior During Therapy: Flat affect Overall Cognitive Status: No family/caregiver present to determine baseline cognitive functioning                                 General Comments: pt able to tell me she was in the hospital given increased time, able to state the year but not the month; hx of dementia at baseline        Exercises Exercises: General Upper Extremity;General Lower Extremity General Exercises - Upper Extremity Shoulder Flexion: AAROM;Both;10 reps;Supine Shoulder Extension: AAROM;10 reps;Both;Supine General Exercises - Lower Extremity Ankle Circles/Pumps: AROM;5 reps;Seated Long Arc Quad: AROM;10 reps;Both;Seated(2 sets of 5)   Shoulder Instructions       General Comments pt appearing SOB with activity today, O2 sats monitored and stable on RA    Pertinent Vitals/ Pain       Pain Assessment:  No/denies pain  Home Living                                          Prior Functioning/Environment              Frequency  Min 2X/week        Progress Toward Goals  OT Goals(current goals can now be found in the care plan section)  Progress towards OT goals: Progressing toward goals  Acute Rehab OT Goals Patient Stated Goal: to get stronger OT Goal Formulation: With patient Time For Goal Achievement: 04/30/18 Potential to Achieve Goals: Good  Plan Discharge plan remains appropriate    Co-evaluation                 AM-PAC OT "6 Clicks" Daily Activity     Outcome Measure   Help from another person eating meals?: A Little Help  from another person taking care of personal grooming?: A Little Help from another person toileting, which includes using toliet, bedpan, or urinal?: A Lot Help from another person bathing (including washing, rinsing, drying)?: A Lot Help from another person to put on and taking off regular upper body clothing?: A Lot Help from another person to put on and taking off regular lower body clothing?: A Lot 6 Click Score: 14    End of Session    OT Visit Diagnosis: Unsteadiness on feet (R26.81);History of falling (Z91.81)   Activity Tolerance Patient tolerated treatment well   Patient Left in bed;with call bell/phone within reach;with bed alarm set   Nurse Communication Mobility status        Time: 6294-7654 OT Time Calculation (min): 24 min  Charges: OT General Charges $OT Visit: 1 Visit OT Treatments $Self Care/Home Management : 23-37 mins  Marcy Siren, OT Supplemental Rehabilitation Services Pager [phone] Office [phone]   Orlando Penner 04/18/2018, 4:07 PM

## 2018-04-19 DIAGNOSIS — F332 Major depressive disorder, recurrent severe without psychotic features: Secondary | ICD-10-CM | POA: Diagnosis not present

## 2018-04-19 DIAGNOSIS — F329 Major depressive disorder, single episode, unspecified: Secondary | ICD-10-CM | POA: Diagnosis not present

## 2018-04-19 DIAGNOSIS — R45851 Suicidal ideations: Secondary | ICD-10-CM | POA: Diagnosis not present

## 2018-04-19 DIAGNOSIS — R627 Adult failure to thrive: Secondary | ICD-10-CM | POA: Diagnosis not present

## 2018-04-19 LAB — CBC WITH DIFFERENTIAL/PLATELET
ABS IMMATURE GRANULOCYTES: 0.04 10*3/uL (ref 0.00–0.07)
Basophils Absolute: 0.1 10*3/uL (ref 0.0–0.1)
Basophils Relative: 1 %
Eosinophils Absolute: 0.3 10*3/uL (ref 0.0–0.5)
Eosinophils Relative: 3 %
HCT: 33.7 % — ABNORMAL LOW (ref 36.0–46.0)
Hemoglobin: 10.7 g/dL — ABNORMAL LOW (ref 12.0–15.0)
IMMATURE GRANULOCYTES: 0 %
LYMPHS ABS: 1.4 10*3/uL (ref 0.7–4.0)
Lymphocytes Relative: 15 %
MCH: 28.5 pg (ref 26.0–34.0)
MCHC: 31.8 g/dL (ref 30.0–36.0)
MCV: 89.6 fL (ref 80.0–100.0)
Monocytes Absolute: 1.1 10*3/uL — ABNORMAL HIGH (ref 0.1–1.0)
Monocytes Relative: 12 %
Neutro Abs: 6.4 10*3/uL (ref 1.7–7.7)
Neutrophils Relative %: 69 %
Platelets: 334 10*3/uL (ref 150–400)
RBC: 3.76 MIL/uL — ABNORMAL LOW (ref 3.87–5.11)
RDW: 14.1 % (ref 11.5–15.5)
WBC: 9.2 10*3/uL (ref 4.0–10.5)
nRBC: 0 % (ref 0.0–0.2)

## 2018-04-19 LAB — BASIC METABOLIC PANEL
ANION GAP: 6 (ref 5–15)
BUN: 26 mg/dL — ABNORMAL HIGH (ref 8–23)
CO2: 26 mmol/L (ref 22–32)
Calcium: 9.4 mg/dL (ref 8.9–10.3)
Chloride: 96 mmol/L — ABNORMAL LOW (ref 98–111)
Creatinine, Ser: 0.67 mg/dL (ref 0.44–1.00)
GFR calc non Af Amer: >60 mL/min (ref >60)
Glucose, Bld: 112 mg/dL — ABNORMAL HIGH (ref 70–99)
Potassium: 4.3 mmol/L (ref 3.5–5.1)
Sodium: 128 mmol/L — ABNORMAL LOW (ref 135–145)

## 2018-04-19 LAB — MAGNESIUM: Magnesium: 2.2 mg/dL (ref 1.7–2.4)

## 2018-04-19 MED ORDER — AMLODIPINE BESYLATE 10 MG PO TABS: 10.0000 mg | ORAL_TABLET | Freq: Every day | ORAL | 3 refills | Status: AC

## 2018-04-19 MED ORDER — MIRTAZAPINE 15 MG PO TBDP: 7.5000 mg | ORAL_TABLET | Freq: Every day | ORAL | 0 refills | Status: AC

## 2018-04-19 NOTE — Care Management (Signed)
This CM was alerted by CSW that plan is now for home due to no SNF bed offers. Choice offered to daughter Steward Drone for home health services and [facility] chosen. Per [employer] liaison SOC date would be 2/5. This was okay with Steward Drone when informed. Home health orders received and faxed to [phone]). No DME needs expressed by daughter. Sandford Craze RN,BSN [phone]

## 2018-04-19 NOTE — Progress Notes (Addendum)
Discussed pt's disposition plan with daughter Steward Drone this morning. Unfortunately due to recent documented overdose/SI, pt will be ineligible for SNF admission (have pursued with many facilities). Steward Drone understanding and reports that since pt has 24/7 supervision at home, they will plan to bring her home and requested HHPT and RN. CM coordinated.  Daughter and son-in-law report they can transport pt home today.  Ilean Skill, MSW, LCSW Clinical Social Work 04/19/2018 [phone]  Spoke with pt's daughter and son in law- they don't have additional family members to help them transport pt home tonight. (states additional family members they thought were available won't be until tomorrow.) Consulted with unit staff and decided PTAR needed for safe transport home. Family agrees. CSW arranged.

## 2018-04-19 NOTE — Discharge Summary (Signed)
Physician Discharge Summary   Patient ID: Tina Wheeler MRN: [id number] DOB/AGE: September 03, 1931 85 y.o.  Admit date: 04/14/2018 Discharge date: 04/19/2018  Primary Care Physician:  Madolyn Friezeh Park, Etta QuillAngela J, MD   Recommendations for Outpatient Follow-up:  1. Follow up with PCP in 1-2 weeks  Home Health: Home health PT OT Equipment/Devices:   Discharge Condition: stable  CODE STATUS:  DNR   Diet recommendation: Heart healthy diet   Discharge Diagnoses:    Major depressive disorder, recurrent severe, without psychosis Suicidal attempt with Tylenol . Failure to thrive in adult E. 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 Morphine Other (See Comments)     DISCHARGE MEDICATIONS: Allergies as of 04/19/2018      Reactions   Morphine Other (See Comments)      Medication List    STOP taking these medications   lisinopril-hydrochlorothiazide 20-12.5 MG tablet Commonly known as:  PRINZIDE,ZESTORETIC     TAKE these medications   amLODipine 10 MG tablet Commonly known as:  NORVASC Take 1 tablet (10 mg total) by mouth daily.   hydroxypropyl methylcellulose / hypromellose 2.5 % ophthalmic solution Commonly known as:  ISOPTO TEARS / GONIOVISC Place 1 drop 3 (three) times daily as needed into the left eye for dry eyes.   levothyroxine 100 MCG tablet Commonly known as:  SYNTHROID, LEVOTHROID Take 100 mcg by mouth daily before breakfast.   lisinopril 20 MG tablet Commonly known as:  PRINIVIL,ZESTRIL Take 20 mg daily by mouth.   mirtazapine 15 MG disintegrating tablet Commonly known as:  REMERON SOL-TAB Take 0.5 tablets (7.5 mg total) by mouth at bedtime.   MULTIVITAMIN ADULT PO Take 1 tablet by mouth daily.   prednisoLONE acetate 0.12 % ophthalmic suspension Commonly known as:  PRED  MILD Place 1 drop into the right eye 2 (two) times daily.   Vitamin D (Ergocalciferol) 1.25 MG (50000 UT) Caps capsule Commonly known as:  DRISDOL Take 50,000 Units by mouth every 7 (seven) days.        Brief H and P: For complete details please refer to admission H and P, but in brief Tina L Combsis a 85 y.o.femalewithhx of HTN, hypothyroidism,history of left hip partial arthroplasty and right knee surgery presented to the hospital with complaints of suicidal ideation and attempt with Tylenol ingestion. Patientreportedly was diagnosed with chronic depression several years ago by her provider, but no known suicide attempts. She was raised in foster homes and was separated from her biological sister nearly 40 years ago. Family denies alcohol abuse or other substance abuse. Since her hip fracture and surgery last year, family reports that patient has been increasingly avoiding social interaction and has been expressing guilt that she is a "burden" to her family. This past month, she has been refusing to eat "barely anything" and has been stating that she wanted to "die.Family also notedchronic intermittent increased urinary frequency (baseline urinary incontinence) but otherwise has not noted any fevers/chills or malodorous urine. Patient did have a fall where she injured her R foot, but denies head trauma.    Hospital Course:  MDD (major depressive disorder), recurrent severe, without psychosis (HCC) with suicidal attempt with Tylenol overdose -Patient was seen by psychiatry, deemed low risk for hurting herself at this time -Ophelia CharterSitter was discontinued, started on Remeron -Urine drug screen was negative for alcohol or Tylenol  on admission -Patient will be discharged home with home health PT OT, RN, home health aide.  SNF search was unsuccessful.  Failure to thrive -PT OT evaluation recommended skilled nursing facility -Dietitian was consulted  E. coli UTI -Urine culture showed  80,000 colonies of E. coli, Ceftin, transitioned to oral Keflex.  Patient has completed course of antibiotics.  Hypothyroidism -TSH 5.8, free T4 1.3, continue Synthroid -Follow-up TSH in 4 weeks.  Essential hypertension -BP improving, continue Norvasc 10 mg daily, continue lisinopril.  Chronic hyponatremia -Likely from SIADH, urine osmolality 434  Chronic multifactorial anemia Hold off with iron supplements due to poor oral intake  Mild bilateral lower extremity edema Chronic  Recent fall with right foot bruising Continue splint, pain control, PT  Day of Discharge S: No acute complaints, no chest pain, shortness of breath, abdominal pain  BP (!) 142/65 (BP Location: Right Arm)   Pulse (!) 102   Temp 99 F (37.2 C) (Oral)   Resp 16   Ht 5\' 8"  (1.727 m)   Wt 80 kg   SpO2 95%   BMI 26.82 kg/m   Physical Exam: General: Alert and awake, NAD HEENT: anicteric sclera, pupils reactive to light and accommodation CVS: S1-S2 clear no murmur rubs or gallops Chest: clear to auscultation bilaterally, no wheezing rales or rhonchi Abdomen: soft nontender, nondistended, normal bowel sounds Extremities: Right lower extremity in splint Neuro: No new deficits   The results of significant diagnostics from this hospitalization (including imaging, microbiology, ancillary and laboratory) are listed below for reference.      Procedures/Studies:  Dg Chest 1 View  Result Date: 04/14/2018 CLINICAL DATA:  Weakness EXAM: CHEST  1 VIEW COMPARISON:  02/02/2017 FINDINGS: Cardiac shadows within normal limits. Aortic calcifications are noted. The lungs are well aerated bilaterally. No focal infiltrate or sizable effusion is seen. No bony abnormality is noted. IMPRESSION: No acute abnormality seen. Electronically Signed   By: Alcide Clever M.D.   On: 04/14/2018 17:11   Ct Head Wo Contrast  Result Date: 04/14/2018 CLINICAL DATA:  Weakness EXAM: CT HEAD WITHOUT CONTRAST TECHNIQUE: Contiguous  axial images were obtained from the base of the skull through the vertex without intravenous contrast. COMPARISON:  None. FINDINGS: Brain: Atrophic and chronic white matter ischemic changes are noted. No findings to suggest acute hemorrhage, acute infarction or space-occupying mass lesion are noted. Vascular: No hyperdense vessel or unexpected calcification. Skull: Normal. Negative for fracture or focal lesion. Sinuses/Orbits: No acute finding. Other: None. IMPRESSION: Chronic atrophic and ischemic changes without acute abnormality. Electronically Signed   By: Alcide Clever M.D.   On: 04/14/2018 17:05   Dg Foot Complete Right  Result Date: 04/14/2018 CLINICAL DATA:  Pain and bruising across the foot, initial encounter EXAM: RIGHT FOOT COMPLETE - 3+ VIEW COMPARISON:  None. FINDINGS: Generalized osteopenia is noted. There are changes consistent with a healing fracture in the distal aspect of the third metatarsal. No other fracture is seen. No soft tissue abnormality is noted. IMPRESSION: Healing third metatarsal fracture distally. No other focal abnormality is noted. Electronically Signed   By: Alcide Clever M.D.   On: 04/14/2018 21:23       LAB RESULTS: Basic Metabolic Panel: Recent Labs  Lab 04/16/18 0503  04/18/18 0341 04/19/18 0313  NA 126*  127*   < > 127* 128*  K 3.9  4.0   < > 4.2 4.3  CL 92*  92*   < > 94* 96*  CO2 27  27   < >  26 26  GLUCOSE 117*  118*   < > 110* 112*  BUN 14  14   < > 23 26*  CREATININE 0.63  0.67   < > 0.66 0.67  CALCIUM 9.3  9.4   < > 9.1 9.4  MG 2.0   < > 2.1 2.2  PHOS 3.1  --   --   --    < > = values in this interval not displayed.   Liver Function Tests: Recent Labs  Lab 04/14/18 1524 04/16/18 0503  AST 43*  --   ALT 22  --   ALKPHOS 124  --   BILITOT 0.6  --   PROT 5.5*  --   ALBUMIN 2.8* 2.5*   Recent Labs  Lab 04/14/18 1604  LIPASE 35   No results for input(s): AMMONIA in the last 168 hours. CBC: Recent Labs  Lab 04/18/18 0341  04/19/18 0313  WBC 11.0* 9.2  NEUTROABS 7.0 6.4  HGB 10.6* 10.7*  HCT 32.7* 33.7*  MCV 89.1 89.6  PLT 315 334   Cardiac Enzymes: Recent Labs  Lab 04/14/18 1604  TROPONINI <0.03   BNP: Invalid input(s): POCBNP CBG: No results for input(s): GLUCAP in the last 168 hours.    Disposition and Follow-up: Discharge Instructions    Diet - low sodium heart healthy   Complete by:  As directed    Increase activity slowly   Complete by:  As directed        DISPOSITION: Home health PT OT, RN, aide, with daughter   DISCHARGE FOLLOW-UP Follow-up Information    Oh Park, Etta Quill, MD. Schedule an appointment as soon as possible for a visit in 2 week(s).   Contact information: [address] [place] [address] [phone]            Time coordinating discharge:  35 minutes  Signed:   Thad Ranger M.D. [facility] 04/19/2018, 11:56 AM Pager: [phone]

## 2018-04-20 DIAGNOSIS — R45851 Suicidal ideations: Secondary | ICD-10-CM | POA: Diagnosis not present

## 2019-08-04 IMAGING — CR DG FOOT COMPLETE 3+V*R*
3 series · 3 of 3 positions shown · non-contrast
Comparison: None.

CLINICAL DATA: Pain and bruising across the foot, initial encounter

EXAM:
RIGHT FOOT COMPLETE - 3+ VIEW

[x foot ap right]
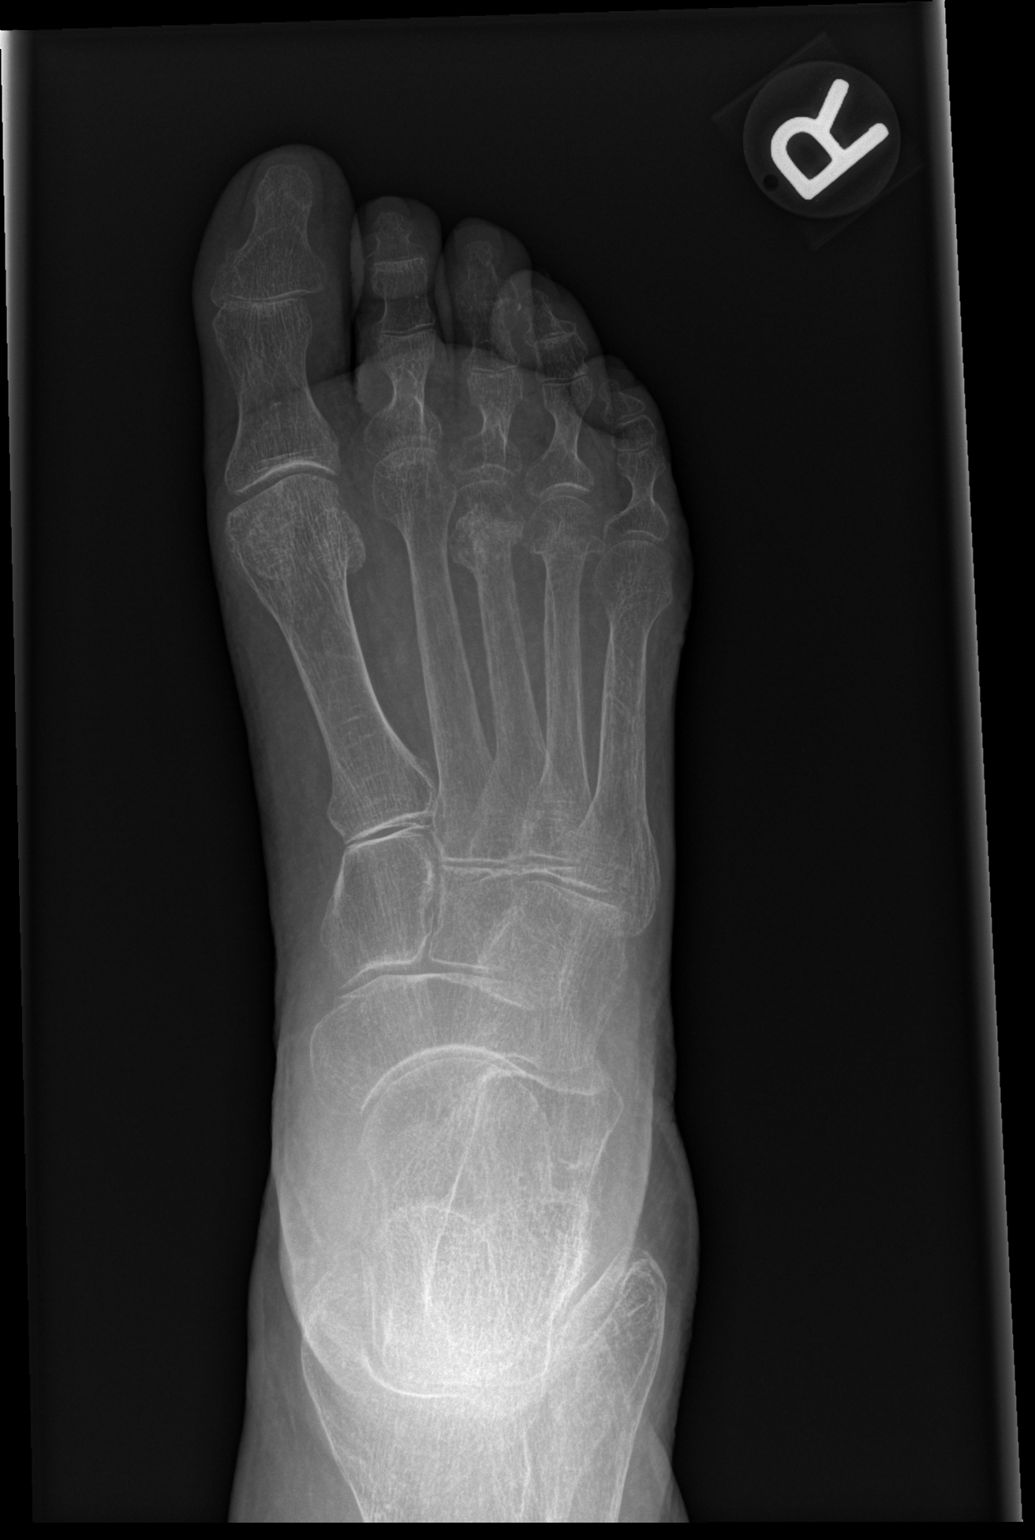

[x foot obl right]
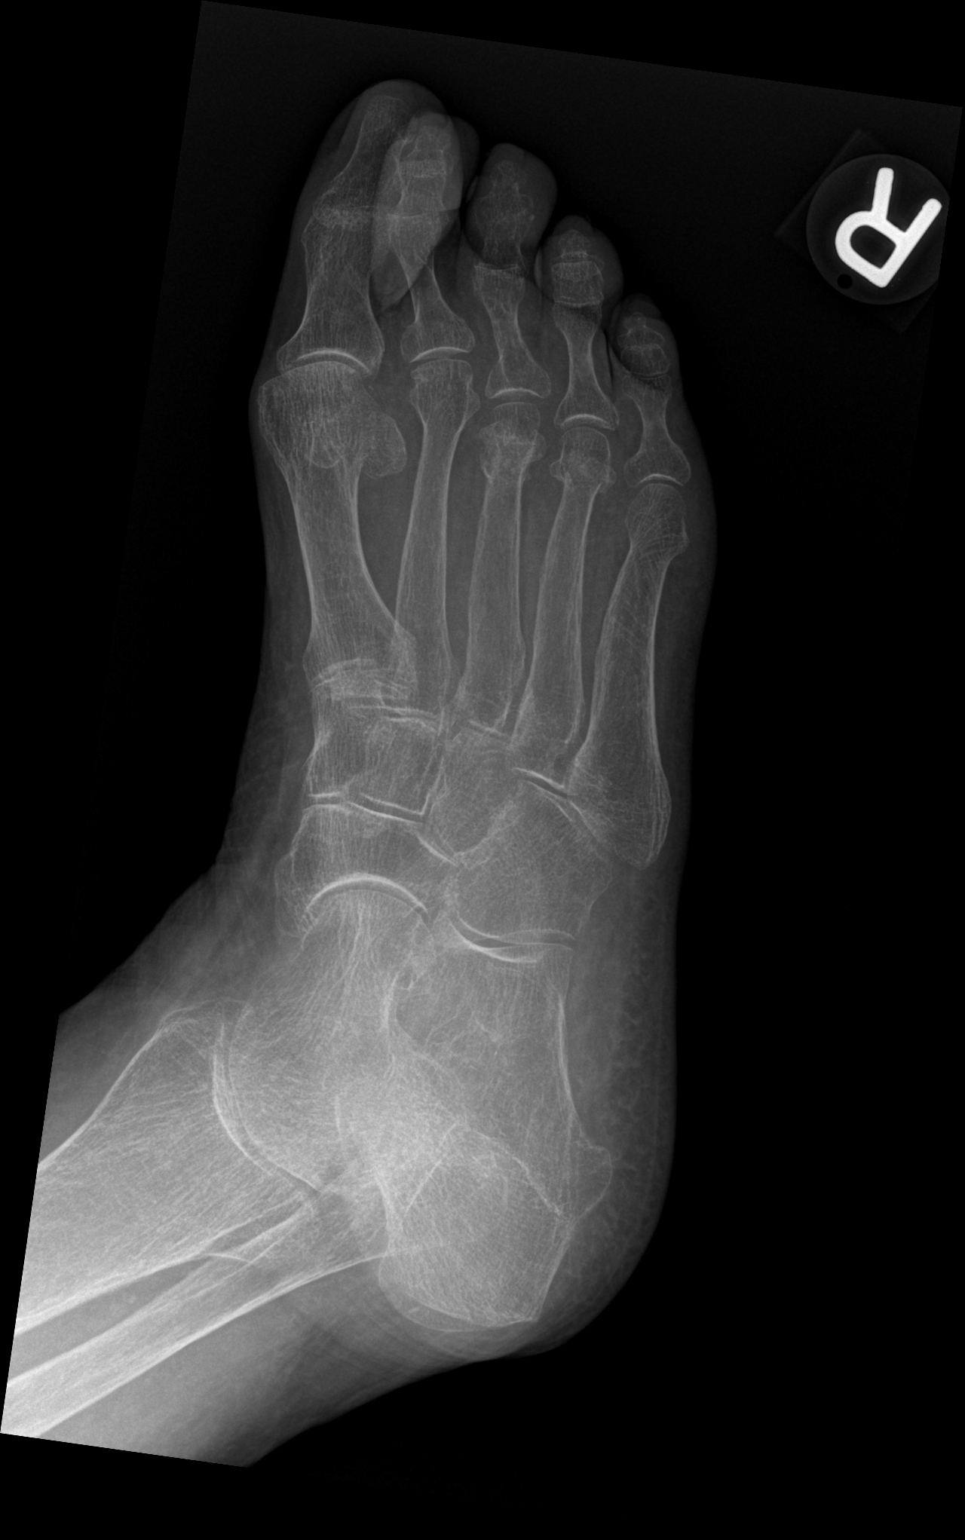

[x foot lat right]
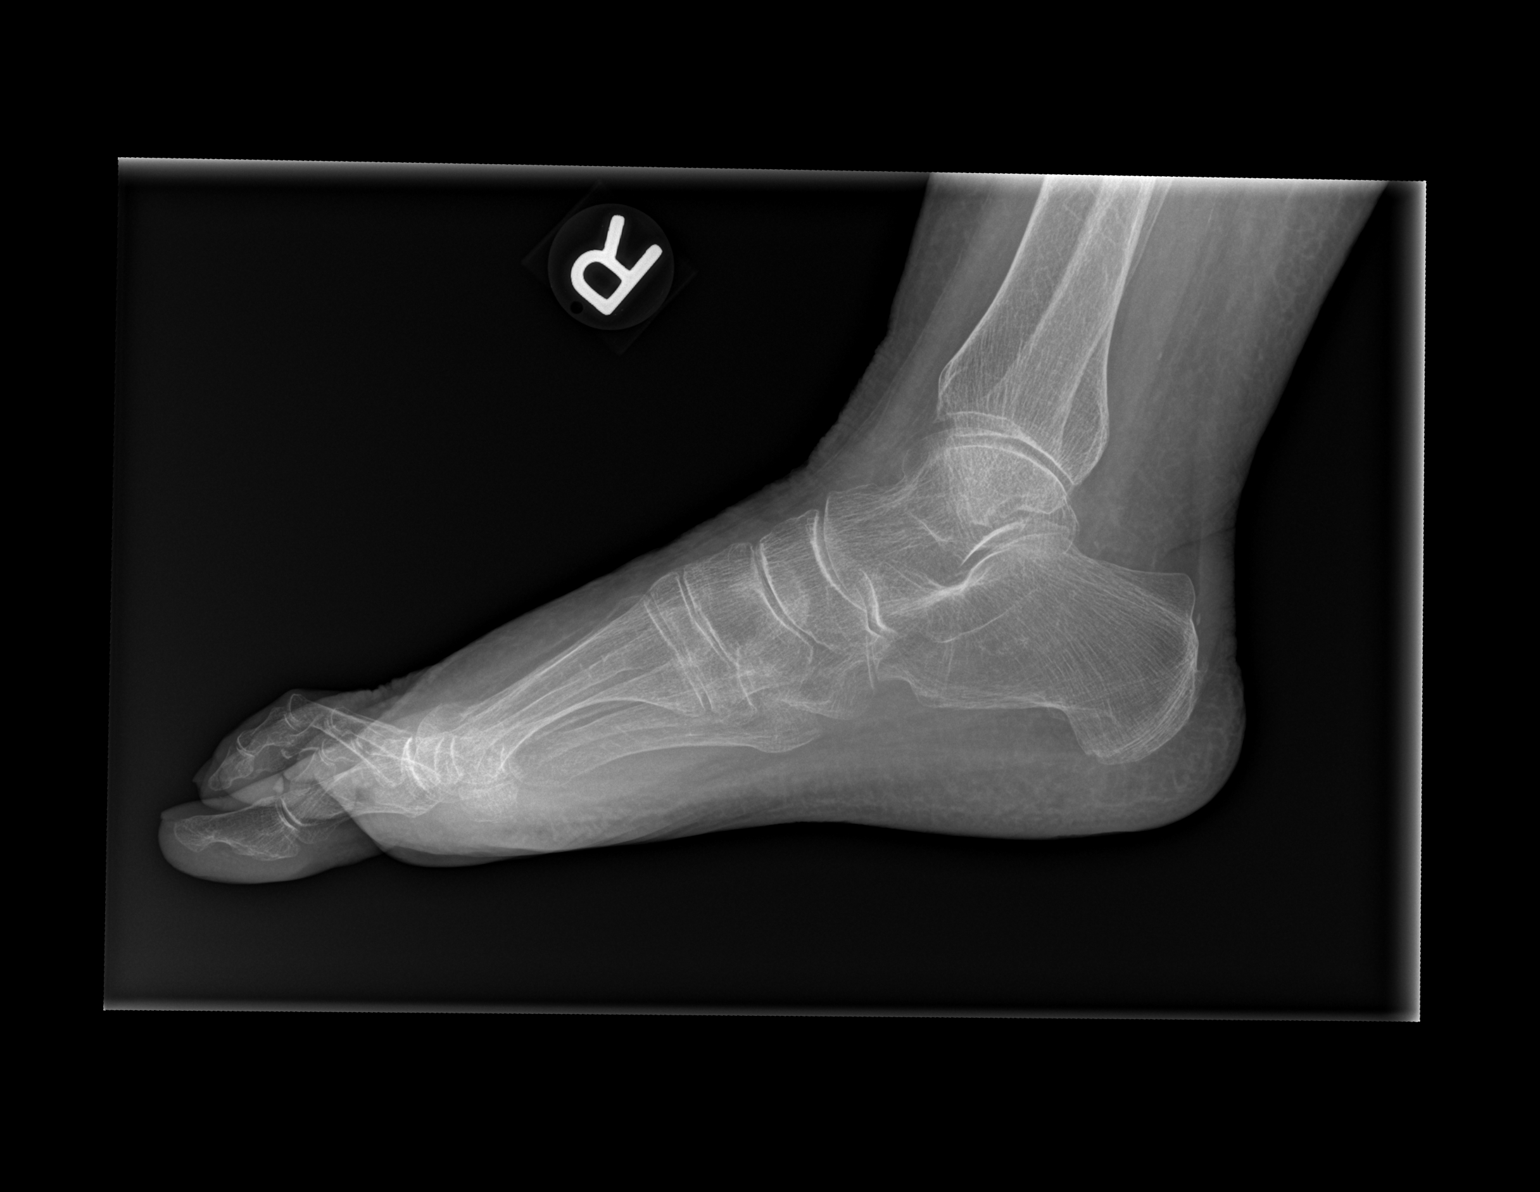

[3 of 3 positions shown; findings below may reference images not displayed]

FINDINGS: Generalized osteopenia is noted. There are changes consistent with a
healing fracture in the distal aspect of the third metatarsal. No
other fracture is seen. No soft tissue abnormality is noted.
IMPRESSION: Healing third metatarsal fracture distally. No other focal
abnormality is noted.
